# Patient Record
Sex: Male | Born: 1975 | Race: White | Hispanic: No | Marital: Married | Smoking: Never smoker
Health system: Southern US, Community
[De-identification: ages and names within clinical notes are randomized; demographics above are authoritative.]

## PROBLEM LIST (undated history)

## (undated) DIAGNOSIS — E669 Obesity, unspecified: Secondary | ICD-10-CM

## (undated) DIAGNOSIS — R079 Chest pain, unspecified: Secondary | ICD-10-CM

## (undated) DIAGNOSIS — I10 Essential (primary) hypertension: Secondary | ICD-10-CM

## (undated) DIAGNOSIS — E785 Hyperlipidemia, unspecified: Secondary | ICD-10-CM

## (undated) DIAGNOSIS — I2510 Atherosclerotic heart disease of native coronary artery without angina pectoris: Secondary | ICD-10-CM

## (undated) HISTORY — DX: Hyperlipidemia, unspecified: E78.5

## (undated) HISTORY — PX: OTHER SURGICAL HISTORY: SHX169

## (undated) HISTORY — DX: Chest pain, unspecified: R07.9

## (undated) HISTORY — DX: Obesity, unspecified: E66.9

## (undated) HISTORY — DX: Atherosclerotic heart disease of native coronary artery without angina pectoris: I25.10

## (undated) HISTORY — DX: Essential (primary) hypertension: I10

---

## 2000-08-07 HISTORY — PX: APPENDECTOMY: SHX54

## 2004-03-06 ENCOUNTER — Encounter (INDEPENDENT_AMBULATORY_CARE_PROVIDER_SITE_OTHER): Payer: Self-pay | Admitting: *Deleted

## 2004-03-06 ENCOUNTER — Observation Stay (HOSPITAL_COMMUNITY): Admission: EM | Admit: 2004-03-06 | Discharge: 2004-03-07 | Payer: Self-pay | Admitting: Emergency Medicine

## 2009-02-02 ENCOUNTER — Encounter: Admission: RE | Admit: 2009-02-02 | Discharge: 2009-02-02 | Payer: Self-pay | Admitting: Gastroenterology

## 2010-12-23 NOTE — H&P (Signed)
NAME:  Victor Delgado, POTVIN                           ACCOUNT NO.:  [id number]   MEDICAL RECORD NO.:  [id number]                   PATIENT TYPE:  EMS   LOCATION:  ED                                   FACILITY:  [facility]   PHYSICIAN:  Angelia Mould. Derrell Lolling, M.D.             DATE OF BIRTH:  1976/01/23   DATE OF ADMISSION:  03/06/2004  DATE OF DISCHARGE:                                HISTORY & PHYSICAL   CHIEF COMPLAINT:  Abdominal pain and nausea.   HISTORY OF PRESENT ILLNESS:  This is a 35 year old white male who is in  excellent health.  Yesterday at about 6 p.m., he developed the gradual onset  of diffuse abdominal pain. He became nauseated and almost vomited, but has  not vomited.  The pain has become more localized to the right lower quadrant  today.  He denies any fever, chills, diarrhea.  He had a bowel movement this  morning after taking some Peri-Colace last night. He is having no trouble  voiding, he is not having any back pain.  He has not had any prior similar  episodes.  He does not have a history of any gastrointestinal disease.   He was seen at [facility] by Dr. Kathrynn Running who thought he might have  appendicitis and the patient was transferred to the [facility] Emergency  Room after discussion with me.   PAST MEDICAL HISTORY:   PAST SURGICAL HISTORY:  Open reduction internal fixation, left forearm  fracture several years ago.  Otherwise no medical or surgical problems.   CURRENT MEDICATIONS:  None.   DRUG ALLERGIES:  NONE KNOWN.   SOCIAL HISTORY:  The patient is married. He has 1 child, age 54 weeks. 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 Denies the use of tobacco, drinks  alcohol occasionally.   FAMILY HISTORY:  Mother living, has diabetes. Father deceased in an accident  at work.  He has 8 siblings, he has 4 half-brothers. There have been a  couple of appendectomies in the family.   REVIEW OF SYSTEMS:  All systems are reviewed. They are noncontributory  except as  described above.   PHYSICAL EXAMINATION:  VITAL SIGNS:  Temperature 98.9, pulse 91,  respirations 17, blood pressure 131/86.  GENERAL:  Pleasant, healthy-appearing young white male in moderate to mild  distress.  HEENT:  Eyes:  Sclerae clear, extraocular movements intact.  Ears, nose,  mouth and throat, nose, lips, tongue, oropharynx:  Without gross lesions.  He has complete upper dental plate.  NECK:  Supple.  No tenderness, no mass, no jugulovenous distention.  LUNGS:  Clear to auscultation, no chest wall tenderness.  CARDIOVASCULAR:  Regular rate and rhythm, no murmur.  ABDOMEN:  A little bit muscular, soft, hypoactive bowel sounds, not  distended. Liver and spleen not enlarged, no hernias noted.  He has  localized tenderness and involuntary guarding in the right lower quadrant,  a  little bit of direct rebound but not much indirect rebound.  GENITALIA:  Penis, scrotum and testes are normal to inspection. No  tenderness or mass, no inguinal adenopathy, no inguinal hernia.  RECTAL:  Not performed.  EXTREMITIES: Free range of motion, no deformity.  There are some well-healed  scars in the left forearm.  No peripheral edema. Radial, femoral and  posterior tibial pulses are palpable throughout.  NEUROLOGIC:  No gross motor or sensory deficits.   LABORATORY DATA:  On admission, hemoglobin 17.9, white blood cell count  14,300. Urinalysis clear except for 3-4 rbc's.   ASSESSMENT:  Probable acute appendicitis. I cannot rule out Meckel's  diverticulum, cecal diverticulitis, mesenteric adenitis or other more  obscure diagnosis, but it seems highly likely that acute appendicitis is his  primary diagnosis.   PLAN:  1. The patient will be taken to the operating room for diagnostic     laparoscopic and laparoscopic appendectomy.  2. I have explained the indications and details of surgery to the patient.     Risks and complications have been outlined, including but not limited to      bleeding, infection, conversion to open laparotomy, injury to adjacent     organs, wound problems such as infection or hernia, cardiac, pulmonary     and thromboembolic problems. He is also aware of the possibility of     alternative diagnosis which may require more extensive surgery.   At this time, all his questions are answered. He agrees with the plan and is  ready to go ahead with the surgery.                                               Angelia Mould. Derrell Lolling, M.D.    HMI/MEDQ  D:  03/06/2004  T:  03/06/2004  Job:  [id number]   cc:   Kathrynn Running, Dr.

## 2010-12-23 NOTE — Op Note (Signed)
NAME:  Victor Delgado, ROULSTON                           ACCOUNT NO.:  [id number]   MEDICAL RECORD NO.:  [id number]                   PATIENT TYPE:  INP   LOCATION:  0104                                 FACILITY:  [facility]   PHYSICIAN:  Angelia Mould. Derrell Lolling, M.D.             DATE OF BIRTH:  1975/12/20   DATE OF PROCEDURE:  03/06/2004  DATE OF DISCHARGE:                                 OPERATIVE REPORT   PREOPERATIVE DIAGNOSIS:  Acute appendicitis.   POSTOPERATIVE DIAGNOSIS:  Acute appendicitis.   OPERATION PERFORMED:  Laparoscopic appendectomy.   SURGEON:  Angelia Mould. Derrell Lolling, M.D.   OPERATIVE INDICATIONS:  This is a 35 year old white male in excellent health  who presents with an 18-hour history of lower abdominal pain and nausea.  The pain has been progressive and localized to the right lower quadrant.  Examination reveals localized tenderness and guarding in the right lower  quadrant.   Lab work shows a white blood cell count of 14,000, and otherwise workup is  unremarkable.  He is clinically felt to have acute appendicitis and is  brought to the operating room urgently.   OPERATIVE FINDINGS:  The appendix was acutely inflamed and had exudate on  the surface, but there was no evidence of abscess, perforation, or gangrene.  The terminal ileum and cecum looks normal.  There was no ascites.   OPERATIVE TECHNIQUE:  Following the induction of general endotracheal  anesthesia, a Foley catheter was inserted.  The abdomen was prepped and  draped in a sterile fashion.  Marcaine 0.5% with epinephrine was used as  local infiltration anesthetic.  A 12 mm Optiview port was placed in the  lateral aspect of the left rectus sheath just above the level of the  umbilicus.  This was inserted without any difficulty.  Insufflation was  carried out, and we found that everything looked fine.  There was no  evidence of any injury or bleeding.  The liver and gallbladder looks normal.  We placed the patient in  Trendelenburg position and rotated to the left.  We  were able to see the tip of the appendix with an acute inflammation and  exudate.  A 5 mm trocar was placed in the left mid abdomen and another 12 mm  trocar was placed in the left suprapubic area.  We identified the tip of the  appendix and circled it with an endo-loop tie.  We swept the small bowel  away from the appendiceal mesentery, identified the cecum and the ileocecal  valve.  We then used the harmonic scalpel to take down the appendiceal  mesentery stepwise until we had completely mobilized the appendix and could  clearly see the insertion of the base of the appendix onto the cecum.  We  transected the base of the appendix with a GIA stapling device, placed the  appendix in a specimen bag and removed it.  We irrigated the operative  field  with some saline.  The staple line was inspected and looked quite nice.  The  terminal ileum and cecum looked fine.  There was no evidence of any injury.  The trocars were removed under direct vision.  There was no bleeding from  the trocar sites.  The pneumoperitoneum was released.  The fascia of the  left suprapubic area was closed with 0 Vicryl sutures.  The incisions were  irrigated with saline.  Skin was close with a subcuticular suture of 4-0  Vicryl and Steri-Strips.  Clean bandages were placed, and the patient was  taken to the recovery room in stable condition.  Estimated blood loss was  about 10-20 cc.   COMPLICATIONS:  None.   SPONGE, NEEDLE, INSTRUMENT COUNTS:  Correct.                                               Angelia Mould. Derrell Lolling, M.D.    HMI/MEDQ  D:  03/06/2004  T:  03/06/2004  Job:  [id number]   cc:   Dr. Kathrynn Running  [facility]

## 2012-05-13 ENCOUNTER — Ambulatory Visit (INDEPENDENT_AMBULATORY_CARE_PROVIDER_SITE_OTHER): Payer: BC Managed Care – PPO | Admitting: Family Medicine

## 2012-05-13 VITALS — BP 136/84 | HR 70 | Temp 98.5°F | Resp 16 | Ht 72.0 in | Wt 263.4 lb

## 2012-05-13 DIAGNOSIS — L237 Allergic contact dermatitis due to plants, except food: Secondary | ICD-10-CM

## 2012-05-13 DIAGNOSIS — L255 Unspecified contact dermatitis due to plants, except food: Secondary | ICD-10-CM

## 2012-05-13 DIAGNOSIS — H6690 Otitis media, unspecified, unspecified ear: Secondary | ICD-10-CM

## 2012-05-13 MED ORDER — AMOXICILLIN-POT CLAVULANATE 875-125 MG PO TABS: 1.0000 | ORAL_TABLET | Freq: Two times a day (BID) | ORAL | Status: DC

## 2012-05-13 NOTE — Progress Notes (Signed)
  Subjective:    Patient ID: Victor Delgado, male    DOB: 1976/04/27, 36 y.o.   MRN: [id number]  HPI Right ear pain x 1 week, outer ear is red and he states he had gotten poison ivy on it.  Denies fever. He went to dentist Friday and had some dental work done, but states he felt the same afterwards.   Review of Systems No fever or sore throat  Social:  Works on signs    Objective:   Physical Exam  Victor Delgado is very red with some scaling on outer med aspect of the pinna oroph is clear Neck is supple without adenopathy TM on right is dull with white fluid behind TM       Assessment & Plan:  I believe this is a case of otitis media, although there is some poison ivy type rash externally  1. Otitis media  amoxicillin-clavulanate (AUGMENTIN) 875-125 MG per tablet  2. Poison ivy dermatitis  amoxicillin-clavulanate (AUGMENTIN) 875-125 MG per tablet

## 2012-05-15 ENCOUNTER — Ambulatory Visit: Payer: BC Managed Care – PPO

## 2012-05-15 ENCOUNTER — Telehealth: Payer: Self-pay | Admitting: Family Medicine

## 2012-05-15 ENCOUNTER — Ambulatory Visit (INDEPENDENT_AMBULATORY_CARE_PROVIDER_SITE_OTHER): Payer: BC Managed Care – PPO | Admitting: Family Medicine

## 2012-05-15 VITALS — BP 126/83 | HR 55 | Temp 98.1°F | Resp 16 | Ht 73.0 in | Wt 267.0 lb

## 2012-05-15 DIAGNOSIS — H9209 Otalgia, unspecified ear: Secondary | ICD-10-CM

## 2012-05-15 LAB — POCT CBC
Granulocyte percent: 70.6 %G (ref 37–80)
HCT, POC: 53.4 % (ref 43.5–53.7)
Hemoglobin: 17.4 g/dL (ref 14.1–18.1)
MCH, POC: 30.7 pg (ref 27–31.2)
MCV: 94.2 fL (ref 80–97)
RBC: 5.67 M/uL (ref 4.69–6.13)
WBC: 7.3 10*3/uL (ref 4.6–10.2)

## 2012-05-15 MED ORDER — ANTIPYRINE-BENZOCAINE 5.4-1.4 % OT SOLN: 3.0000 [drp] | OTIC | Status: DC | PRN

## 2012-05-15 MED ORDER — HYDROCODONE-ACETAMINOPHEN 5-500 MG PO TABS: 1.0000 | ORAL_TABLET | Freq: Three times a day (TID) | ORAL | Status: DC | PRN

## 2012-05-15 NOTE — Progress Notes (Signed)
[facility] [address], [place] [address] [phone]  Date:  05/15/2012   Name:  Victor Delgado   DOB:  09/20/1975   MRN:  [id number]  PCP:  No primary provider on file.    Chief Complaint: Follow-up   History of Present Illness:  Victor Delgado is a 36 y.o. very pleasant male patient who presents with the following:  He is here to recheck his right ear.  He was here on Monday and was started on Augmentin for possible OM.  He does not feel any different yet.   He did see the dentist last week- he had a filling but states that he was told there was no reason for him to have pain.  He has upper dentures.   No fever.  No ST.  Hearing seems to be ok- may be a little muffled.  No drainage from the ear.  He hurts worse when he lays down. He has pain through the jaw area as well.  He was up all night last night with pain.  He has tried some naproxen which his dentist gave him.    He is generally heatlhy. He does not feel that this is a HA- he does not have any nausea, vomiting or vision change.    There is no problem list on file for this patient.   No past medical history on file.  No past surgical history on file.  History  Substance Use Topics  . Smoking status: Never Smoker   . Smokeless tobacco: Not on file  . Alcohol Use: Not on file    No family history on file.  No Known Allergies  Medication list has been reviewed and updated.  Current Outpatient Prescriptions on File Prior to Visit  Medication Sig Dispense Refill  . amoxicillin-clavulanate (AUGMENTIN) 875-125 MG per tablet Take 1 tablet by mouth 2 (two) times daily.  20 tablet  0    Review of Systems:  As per HPI- otherwise negative. No fever  Physical Examination: Filed Vitals:   05/15/12 0846  BP: 126/83  Pulse: 55  Temp: 98.1 F (36.7 C)  Resp: 16   Filed Vitals:   05/15/12 0846  Height: 6\' 1"  (1.854 m)  Weight: 267 lb (121.11 kg)   Body mass index is 35.23  kg/(m^2). Ideal Body Weight: Weight in (lb) to have BMI = 25: 189.1   GEN: WDWN, NAD, Non-toxic, A & O x 3, obese HEENT: Atraumatic, Normocephalic. Neck supple. No masses, No LAD. Bilateral TM wnl- no injection, redness or bulge on the right, no discharge of the ear canal, oropharynx normal.  PEERL,EOMI.   No tenderness over the TMJ on the right.  The external right ear has a little scaly rash that seems to be healing.  Per pt report this pre-ceded his pain by a couple of weeks so shingles is not likely.  No other rash.  Ears and Nose: No external deformity. CV: RRR, No M/G/R. No JVD. No thrill. No extra heart sounds. PULM: CTA B, no wheezes, crackles, rhonchi. No retractions. No resp. distress. No accessory muscle use. EXTR: No c/c/e NEURO Normal gait.  PSYCH: Normally interactive. Conversant. Not depressed or anxious appearing.  Calm demeanor.   [facility] reading (PRIMARY) by  Dr. Patsy Lager. Negative sinus film  Clinical Data: Sinus infection, right side pain  PARANASAL SINUSES - 1-2 VIEW  Comparison: None  Findings: Single Waters' view of the paranasal sinuses. Nasal septum midline. Paranasal sinuses  clear. No sinus opacification or air-fluid levels. Bones unremarkable.  IMPRESSION: Normal exam.    Results for orders placed in visit on 05/15/12  POCT CBC      Component Value Range   WBC 7.3  4.6 - 10.2 K/uL   Lymph, poc 1.7  0.6 - 3.4   POC LYMPH PERCENT 23.4  10 - 50 %L   MID (cbc) 0.4  0 - 0.9   POC MID % 6.0  0 - 12 %M   POC Granulocyte 5.2  2 - 6.9   Granulocyte percent 70.6  37 - 80 %G   RBC 5.67  4.69 - 6.13 M/uL   Hemoglobin 17.4  14.1 - 18.1 g/dL   HCT, POC 16.1  09.6 - 53.7 %   MCV 94.2  80 - 97 fL   MCH, POC 30.7  27 - 31.2 pg   MCHC 32.6  31.8 - 35.4 g/dL   RDW, POC 04.5     Platelet Count, POC 330  142 - 424 K/uL   MPV 9.4  0 - 99.8 fL  POCT SEDIMENTATION RATE      Component Value Range   POCT SED RATE 14  0 - 22 mm/hr    Assessment and Plan: 1. Ear  pain  POCT CBC, POCT SEDIMENTATION RATE, DG SinUS 1-2 Views, antipyrine-benzocaine (AURALGAN) otic solution, HYDROcodone-acetaminophen (VICODIN) 5-500 MG per tablet   Victor Delgado is having persistent ear pain. His exam now seems normal.  His ESR is ok, making TA even more unlikely (already very unlikely given his age). He denies having a HA.  Will have his try auralgan drops for his ears to see if this will relieve the pain. If so, TM is probably the cause of his pain. Will give him a small amount of vicodin to have on hand in case he needs it.  Continue augmentin- let me know if not better in the next couple of days, Sooner if worse.    Abbe Amsterdam, MD

## 2012-05-15 NOTE — Telephone Encounter (Signed)
Left message with spouse for patient to return call. Please notify patient Sed Rate normal.

## 2012-05-16 NOTE — Telephone Encounter (Signed)
Pt notified of results

## 2012-09-05 ENCOUNTER — Ambulatory Visit (INDEPENDENT_AMBULATORY_CARE_PROVIDER_SITE_OTHER): Payer: BC Managed Care – PPO | Admitting: Emergency Medicine

## 2012-09-05 ENCOUNTER — Ambulatory Visit: Payer: BC Managed Care – PPO

## 2012-09-05 VITALS — BP 130/98 | HR 88 | Temp 99.0°F | Resp 18 | Wt 263.0 lb

## 2012-09-05 DIAGNOSIS — R05 Cough: Secondary | ICD-10-CM

## 2012-09-05 DIAGNOSIS — R0981 Nasal congestion: Secondary | ICD-10-CM

## 2012-09-05 DIAGNOSIS — J3489 Other specified disorders of nose and nasal sinuses: Secondary | ICD-10-CM

## 2012-09-05 DIAGNOSIS — J189 Pneumonia, unspecified organism: Secondary | ICD-10-CM

## 2012-09-05 LAB — POCT INFLUENZA A/B
Influenza A, POC: NEGATIVE
Influenza B, POC: NEGATIVE

## 2012-09-05 MED ORDER — HYDROCODONE-HOMATROPINE 5-1.5 MG/5ML PO SYRP: 5.0000 mL | ORAL_SOLUTION | Freq: Four times a day (QID) | ORAL | Status: DC | PRN

## 2012-09-05 MED ORDER — LEVOFLOXACIN 500 MG PO TABS: 500.0000 mg | ORAL_TABLET | Freq: Every day | ORAL | Status: AC

## 2012-09-05 NOTE — Patient Instructions (Signed)

## 2012-09-05 NOTE — Progress Notes (Signed)
  Subjective:    Patient ID: Victor Delgado, male    DOB: [birth year], 37 y.o.   MRN: [id number]  HPI Pt comes in with nasal congestion and a cough that started Saturday night. Cough has been nonproductive and he feels chest congestion. It started with chills,  headache, no fever that he recalls, denies muscles aches or sore throat, ear pain or head congestion. Nasal drainage is clear. He has been taking Musinex Cold and Flu without relief. He also has taken Day Quil and that seems to work better. Pt denies smoking.    Review of Systems     Objective:   Physical Exam HEENT exam reveals nasal congestion with clear rhinorrhea. Throat is normal. Neck is supple. Chest is clear to both auscultation and percussion except for some rhonchi present anteriorly. I did not hear any wheezing. Breath sounds were symmetrical. [facility] reading (PRIMARY) by  Dr.Cy Bresee there are increased markings present on the lateral film inferior to the hilum. Results for orders placed in visit on 09/05/12  POCT INFLUENZA A/B      Component Value Range   Influenza A, POC Negative     Influenza B, POC Negative          Assessment & Plan:  Flu swab was obtained and chest x-ray was ordered. Chest x-ray was suspicious to me to the infiltrate present on lateral. We'll treat with Levaquin 500 one a day for 7 days Hycodan for cough. He's to take Mucinex twice a day to break up the mucus rest and fluids.

## 2013-12-30 ENCOUNTER — Encounter: Payer: BC Managed Care – PPO | Admitting: Family Medicine

## 2014-01-13 ENCOUNTER — Other Ambulatory Visit: Payer: Self-pay | Admitting: Family Medicine

## 2014-01-13 ENCOUNTER — Encounter: Payer: BC Managed Care – PPO | Admitting: Family Medicine

## 2014-06-28 ENCOUNTER — Ambulatory Visit (INDEPENDENT_AMBULATORY_CARE_PROVIDER_SITE_OTHER): Payer: BC Managed Care – PPO | Admitting: Physician Assistant

## 2014-06-28 VITALS — BP 120/88 | HR 74 | Temp 97.9°F | Resp 16 | Ht 73.0 in | Wt 261.0 lb

## 2014-06-28 DIAGNOSIS — Z23 Encounter for immunization: Secondary | ICD-10-CM

## 2014-06-28 DIAGNOSIS — R05 Cough: Secondary | ICD-10-CM

## 2014-06-28 DIAGNOSIS — R059 Cough, unspecified: Secondary | ICD-10-CM

## 2014-06-28 DIAGNOSIS — R0981 Nasal congestion: Secondary | ICD-10-CM

## 2014-06-28 DIAGNOSIS — J069 Acute upper respiratory infection, unspecified: Secondary | ICD-10-CM

## 2014-06-28 MED ORDER — HYDROCOD POLST-CHLORPHEN POLST 10-8 MG/5ML PO LQCR: 5.0000 mL | Freq: Two times a day (BID) | ORAL | Status: DC | PRN

## 2014-06-28 MED ORDER — GUAIFENESIN ER 1200 MG PO TB12
1.0000 | ORAL_TABLET | Freq: Two times a day (BID) | ORAL | Status: AC | PRN
Start: 2014-06-28 — End: 2014-07-05

## 2014-06-28 MED ORDER — BENZONATATE 100 MG PO CAPS: 100.0000 mg | ORAL_CAPSULE | Freq: Three times a day (TID) | ORAL | Status: AC | PRN

## 2014-06-28 NOTE — Progress Notes (Signed)
MRN: [id number] DOB: 07/07/1976  Subjective:   Victor Delgado is a 38 y.o. male non-smoker, [facility] days.  He states that it began with a slight cough, that has progressed.  It is non-productive and dramatically worsens at night.  He reports mild nasal congestion associated with the cough, and some sore throat, following the cough.  He has some chest pain along his ribs, secondary to the coughing.  He has take alka-seltzer plus which has helped some.  He denies fever, dyspnea, sob, ear symptoms, n/v, and diarrhea.  He reports having some chills.  He does have some sick contacts at work, but is unsure of their illnesses.         Prior to Admission medications   Medication Sig Start Date End Date Taking? Authorizing Provider  HYDROcodone-homatropine (HYCODAN) 5-1.5 MG/5ML syrup Take 5 mLs by mouth every 6 (six) hours as needed for cough. 09/05/12   Collene GobbleSteven A Daub, MD     No Known Allergies    ROS ROS  Other unremarkable unless listed above   Objective:   Vitals: BP 120/88 mmHg  Pulse 74  Temp(Src) 97.9 F (36.6 C) (Oral)  Resp 16  Ht 6\' 1"  (1.854 m)  Wt 261 lb (118.389 kg)  BMI 34.44 kg/m2  SpO2 98%  Physical Exam  Constitutional: He is oriented to person, place, and time and well-developed, well-nourished, and in no distress. Vital signs are normal. No distress.  BP 120/88 mmHg  Pulse 74  Temp(Src) 97.9 F (36.6 C) (Oral)  Resp 16  Ht 6\' 1"  (1.854 m)  Wt 261 lb (118.389 kg)  BMI 34.44 kg/m2  SpO2 98%   HENT:  Right Ear: Hearing normal. Tympanic membrane is not injected and not bulging. No middle ear effusion.  Left Ear: Hearing normal. Tympanic membrane is not injected and not bulging.  No middle ear effusion.  Nose: Rhinorrhea present. No mucosal edema. Right sinus exhibits no maxillary sinus tenderness and no frontal sinus tenderness. Left sinus exhibits no maxillary sinus tenderness and no frontal sinus tenderness.    Mouth/Throat: No uvula swelling. No oropharyngeal exudate, posterior oropharyngeal edema or posterior oropharyngeal erythema.  Pulmonary/Chest: Effort normal and breath sounds normal. No accessory muscle usage. No apnea. No respiratory distress. He has no decreased breath sounds. He has no wheezes. He has no rhonchi. He has no rales.  Lymphadenopathy:       Head (right side): Submandibular adenopathy present.       Head (left side): No submandibular adenopathy present.    He has no cervical adenopathy.    He has no axillary adenopathy.  Neurological: He is oriented to person, place, and time.    Assessment and Plan :  38 year [facility] 3 days of nasal congestion, sore throat, and cough.  More suspicious that this is viral etiology.  He will f/u in 7 days if symptoms do not improve.    Upper respiratory infection, viral  Cough  Guaifenesin (MUCINEX MAXIMUM STRENGTH) 1200 MG TB12, benzonatate (TESSALON) 100 MG capsule, chlorpheniramine-HYDROcodone (TUSSIONEX PENNKINETIC ER) 10-8 MG/5ML LQCR  Nasal congestion Guaifenesin (MUCINEX MAXIMUM STRENGTH) 1200 MG TB12  Need for influenza vaccination  Flu Vaccine QUAD 36+ mos IM  Need for Tdap vaccination Tdap vaccine greater than or equal to 7yo IM    Victor PlattStephanie English, PA-C [facility] 11/22/20154:28 PM

## 2014-06-28 NOTE — Patient Instructions (Signed)
Upper Respiratory Infection, Adult An upper respiratory infection (URI) is also sometimes known as the common cold. The upper respiratory tract includes the nose, sinuses, throat, trachea, and bronchi. Bronchi are the airways leading to the lungs. Most people improve within 1 week, but symptoms can last up to 2 weeks. A residual cough may last even longer.  CAUSES Many different viruses can infect the tissues lining the upper respiratory tract. The tissues become irritated and inflamed and often become very moist. Mucus production is also common. A cold is contagious. You can easily spread the virus to others by oral contact. This includes kissing, sharing a glass, coughing, or sneezing. Touching your mouth or nose and then touching a surface, which is then touched by another person, can also spread the virus. SYMPTOMS  Symptoms typically develop 1 to 3 days after you come in contact with a cold virus. Symptoms vary from person to person. They may include:  Runny nose.  Sneezing.  Nasal congestion.  Sinus irritation.  Sore throat.  Loss of voice (laryngitis).  Cough.  Fatigue.  Muscle aches.  Loss of appetite.  Headache.  Low-grade fever. DIAGNOSIS  You might diagnose your own cold based on familiar symptoms, since most people get a cold 2 to 3 times a year. Your caregiver can confirm this based on your exam. Most importantly, your caregiver can check that your symptoms are not due to another disease such as strep throat, sinusitis, pneumonia, asthma, or epiglottitis. Blood tests, throat tests, and X-rays are not necessary to diagnose a common cold, but they may sometimes be helpful in excluding other more serious diseases. Your caregiver will decide if any further tests are required. RISKS AND COMPLICATIONS  You may be at risk for a more severe case of the common cold if you smoke cigarettes, have chronic heart disease (such as heart failure) or lung disease (such as asthma), or if  you have a weakened immune system. The very young and very old are also at risk for more serious infections. Bacterial sinusitis, middle ear infections, and bacterial pneumonia can complicate the common cold. The common cold can worsen asthma and chronic obstructive pulmonary disease (COPD). Sometimes, these complications can require emergency medical care and may be life-threatening. PREVENTION  The best way to protect against getting a cold is to practice good hygiene. Avoid oral or hand contact with people with cold symptoms. Wash your hands often if contact occurs. There is no clear evidence that vitamin C, vitamin E, echinacea, or exercise reduces the chance of developing a cold. However, it is always recommended to get plenty of rest and practice good nutrition. TREATMENT  Treatment is directed at relieving symptoms. There is no cure. Antibiotics are not effective, because the infection is caused by a virus, not by bacteria. Treatment may include:  Increased fluid intake. Sports drinks offer valuable electrolytes, sugars, and fluids.  Breathing heated mist or steam (vaporizer or shower).  Eating chicken soup or other clear broths, and maintaining good nutrition.  Getting plenty of rest.  Using gargles or lozenges for comfort.  Controlling fevers with ibuprofen or acetaminophen as directed by your caregiver.  Increasing usage of your inhaler if you have asthma. Zinc gel and zinc lozenges, taken in the first 24 hours of the common cold, can shorten the duration and lessen the severity of symptoms. Pain medicines may help with fever, muscle aches, and throat pain. A variety of non-prescription medicines are available to treat congestion and runny nose. Your caregiver   can make recommendations and may suggest nasal or lung inhalers for other symptoms.  HOME CARE INSTRUCTIONS   Only take over-the-counter or prescription medicines for pain, discomfort, or fever as directed by your  caregiver.  Use a warm mist humidifier or inhale steam from a shower to increase air moisture. This may keep secretions moist and make it easier to breathe.  Drink enough water and fluids to keep your urine clear or pale yellow.  Rest as needed.  Return to work when your temperature has returned to normal or as your caregiver advises. You may need to stay home longer to avoid infecting others. You can also use a face mask and careful hand washing to prevent spread of the virus. SEEK MEDICAL CARE IF:   After the first few days, you feel you are getting worse rather than better.  You need your caregiver's advice about medicines to control symptoms.  You develop chills, worsening shortness of breath, or brown or red sputum. These may be signs of pneumonia.  You develop yellow or brown nasal discharge or pain in the face, especially when you bend forward. These may be signs of sinusitis.  You develop a fever, swollen neck glands, pain with swallowing, or white areas in the back of your throat. These may be signs of strep throat. SEEK IMMEDIATE MEDICAL CARE IF:   You have a fever.  You develop severe or persistent headache, ear pain, sinus pain, or chest pain.  You develop wheezing, a prolonged cough, cough up blood, or have a change in your usual mucus (if you have chronic lung disease).  You develop sore muscles or a stiff neck. Document Released: 01/17/2001 Document Revised: 10/16/2011 Document Reviewed: 10/29/2013 ExitCare Patient Information 2015 [employer]. This information is not intended to replace advice given to you by your health care provider. Make sure you discuss any questions you have with your health care provider.  

## 2016-01-26 ENCOUNTER — Telehealth: Payer: Self-pay | Admitting: Cardiovascular Disease

## 2016-01-26 NOTE — Telephone Encounter (Signed)
Received records from [facility] for appointment on 03/03/16 with Dr Manhattan.  Records given to N Hines (medical records) for Dr Ashley's schedule on 03/03/16. lp °

## 2016-03-03 ENCOUNTER — Ambulatory Visit (INDEPENDENT_AMBULATORY_CARE_PROVIDER_SITE_OTHER): Payer: BC Managed Care – PPO | Admitting: Cardiovascular Disease

## 2016-03-03 ENCOUNTER — Encounter: Payer: Self-pay | Admitting: Cardiovascular Disease

## 2016-03-03 VITALS — BP 126/84 | HR 59 | Ht 73.0 in | Wt 259.2 lb

## 2016-03-03 DIAGNOSIS — R079 Chest pain, unspecified: Secondary | ICD-10-CM

## 2016-03-03 DIAGNOSIS — R0683 Snoring: Secondary | ICD-10-CM | POA: Diagnosis not present

## 2016-03-03 DIAGNOSIS — R072 Precordial pain: Secondary | ICD-10-CM

## 2016-03-03 MED ORDER — NITROGLYCERIN 0.4 MG SL SUBL: 0.4000 mg | SUBLINGUAL_TABLET | SUBLINGUAL | 99 refills | Status: DC | PRN

## 2016-03-03 NOTE — Progress Notes (Signed)
Cardiology Office Note   Date:  03/09/2016   ID:  JSHON IBE, DOB Jun 12, 1976, MRN [id number]  PCP:  Astrid Divine, MD  Cardiologist:   Chilton Si, MD   Chief Complaint  Patient presents with  . New Patient (Initial Visit)  . Chest Pain  . Shortness of Breath      History of Present Illness:  Victor Delgado is a 40 y.o. male who presents for an evaluation of chest pain.  Victor Delgado reports episodes of chest pain that started two months ago.  He notices it with exertion such as when running or playing softball and improves with rest.  He reports it as a dull ache in his left chest.  The pain is 5-6/10 in severity and associated with shortness of breath and diaphoresis.  There is no nausea or palpitations.  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  He notes that his diet is poor and he eats fast food frequently.  He knows that his cholesterol has been elevated in the past.  He has been trying to limit soda intake.  Victor Delgado also notes that he has been more tired lately.  He snores loudly and doesn't feel well-rested in the AM.  He is unsure of whether he has apneic episodes.     Past Medical History:  Diagnosis Date  . Chest pain 03/09/2016    No past surgical history on file.   Current Outpatient Prescriptions  Medication Sig Dispense Refill  . aspirin 81 MG tablet Take 81 mg by mouth daily.    . nitroGLYCERIN (NITROSTAT) 0.4 MG SL tablet Place 1 tablet (0.4 mg total) under the tongue every 5 (five) minutes as needed for chest pain. 25 tablet prn   No current facility-administered medications for this visit.     Allergies:   Review of patient's allergies indicates no known allergies.    Social History:  The patient  reports that he has never smoked. He does not have any smokeless tobacco history on file. He reports that he does not drink alcohol or use drugs.   Family History:  The patient's family history  includes Diabetes in his mother; Heart attack in his paternal grandfather and paternal uncle; Hypertension in his mother; Stroke in his maternal grandfather and maternal grandmother.    ROS:  Please see the history of present illness.   Otherwise, review of systems are positive for none.   All other systems are reviewed and negative.    PHYSICAL EXAM: VS:  BP 126/84   Pulse (!) 59   Ht  (1.854 m)   Wt 259 lb 3.2 oz (117.6 kg)   BMI 34.20 kg/m  , BMI Body mass index is 34.2 kg/m. GENERAL:  Well appearing HEENT:  Pupils equal round and reactive, fundi not visualized, oral mucosa unremarkable NECK:  No jugular venous distention, waveform within normal limits, carotid upstroke brisk and symmetric, no bruits, no thyromegaly LYMPHATICS:  No cervical adenopathy LUNGS:  Clear to auscultation bilaterally HEART:  RRR.  PMI not displaced or sustained,S1 and S2 within normal limits, no S3, no S4, no clicks, no rubs, no murmurs ABD:  Flat, positive bowel sounds normal in frequency in pitch, no bruits, no rebound, no guarding, no midline pulsatile mass, no hepatomegaly, no splenomegaly EXT:  2 plus pulses throughout, no edema, no cyanosis no clubbing SKIN:  No rashes no nodules NEURO:  Cranial nerves II  through XII grossly intact, motor grossly intact throughout [facility]:  Cognitively intact, oriented to person place and time   EKG:  EKG is ordered today. The ekg ordered today demonstrates sinus bradycardia rate 59 bpm.   Recent Labs: No results found for requested labs within last 8760 hours.    Lipid Panel No results found for: CHOL, TRIG, HDL, CHOLHDL, VLDL, LDLCALC, LDLDIRECT   7/61/95: Total cholesterol 20, tiglycerides 2:15,HDL 40, LDL 12   01/20/15: sodium 141, potassium 4, BUN 16, creatinine 0.9 AST 21, ALT 35  TSH 2.99  Wt Readings from Last 3 Encounters:  03/03/16 259 lb 3.2 oz (117.6 kg)  06/28/14 261 lb (118.4 kg)  09/05/12 263 lb (119.3 kg)      ASSESSMENT AND  PLAN:  # Exertional chest pain: Mr. Montney has exertional chest pain that is concerning for ischemia.  He also reports chest pain that is related to swinging his hammer, which seems less likely to be due to ischemia.  We will obtain a treadmill stress test to evaluation for ischemia.  We will also give him a prescription for nitroglycerin and instructed him to go to the ED if he has chest pain that persists after three nitroglycerin tablets.  # Fatigue: # Daytime somnolence: We will obtain a sleep study to evaluate for sleep apnea.    Current medicines are reviewed at length with the patient today.  The patient does not have concerns regarding medicines.  The following changes have been made:  Nitroglycerin prn  Labs/ tests ordered today include:   Orders Placed This Encounter  Procedures  . Exercise Tolerance Test  . EKG 12-Lead  . Split night study     Disposition:   FU with Domenica Weightman C. Duke Salvia, MD, [facility] in 1 month    This note was written with the assistance of speech recognition software.  Please excuse any transcriptional errors.   Signed, Kanae Ignatowski C. Duke Salvia, MD, [facility]  03/09/2016 7:48 PM    [facility]

## 2016-03-03 NOTE — Patient Instructions (Addendum)
Medication Instructions:  Use your NTG under your tongue for recurrent chest pain. May take one tablet every 5 minutes. If you are still having discomfort after 3 tablets in 15 minutes, call 911.  Labwork: none  Testing/Procedures: Your physician has requested that you have an exercise tolerance test. For further information please visit [email or web address] Please also follow instruction sheet, as given.  Your physician has recommended that you have a sleep study. This test records several body functions during sleep, including: brain activity, eye movement, oxygen and carbon dioxide blood levels, heart rate and rhythm, breathing rate and rhythm, the flow of air through your mouth and nose, snoring, body muscle movements, and chest and belly movement.  Follow-Up: Your physician recommends that you schedule a follow-up appointment in: 1 month ov  If you need a refill on your cardiac medications before your next appointment, please call your pharmacy.   Exercise Stress Electrocardiogram An exercise stress electrocardiogram is a test to check how blood flows to your heart. It is done to find areas of poor blood flow. You will need to walk on a treadmill for this test. The electrocardiogram will record your heartbeat when you are at rest and when you are exercising. BEFORE THE PROCEDURE  Do not have drinks with caffeine or foods with caffeine for 24 hours before the test, or as told by your doctor. This includes coffee, tea (even decaf tea), sodas, chocolate, and cocoa.  Follow your doctor's instructions about eating and drinking before the test.  Ask your doctor what medicines you should or should not take before the test. Take your medicines with water unless told by your doctor not to.  If you use an inhaler, bring it with you to the test.  Bring a snack to eat after the test.  Do not  smoke for 4 hours before the test.  Do not put lotions, powders, creams, or oils on your chest  before the test.  Wear comfortable shoes and clothing. PROCEDURE  You will have patches put on your chest. Small areas of your chest may need to be shaved. Wires will be connected to the patches.  Your heart rate will be watched while you are resting and while you are exercising.  You will walk on the treadmill. The treadmill will slowly get faster to raise your heart rate.  The test will take about 1-2 hours. AFTER THE PROCEDURE  Your heart rate and blood pressure will be watched after the test.  You may return to your normal diet, activities, and medicines or as told by your doctor.   This information is not intended to replace advice given to you by your health care provider. Make sure you discuss any questions you have with your health care provider.   Document Released: 01/10/2008 Document Revised: 08/14/2014 Document Reviewed: 03/31/2013 Elsevier Interactive Patient Education [employer]

## 2016-03-09 ENCOUNTER — Encounter: Payer: Self-pay | Admitting: Cardiovascular Disease

## 2016-03-09 DIAGNOSIS — R079 Chest pain, unspecified: Secondary | ICD-10-CM

## 2016-03-09 HISTORY — DX: Chest pain, unspecified: R07.9

## 2016-03-16 ENCOUNTER — Encounter: Payer: Self-pay | Admitting: Cardiovascular Disease

## 2016-03-17 ENCOUNTER — Ambulatory Visit (INDEPENDENT_AMBULATORY_CARE_PROVIDER_SITE_OTHER): Payer: BC Managed Care – PPO

## 2016-03-17 DIAGNOSIS — R079 Chest pain, unspecified: Secondary | ICD-10-CM | POA: Diagnosis not present

## 2016-03-17 LAB — EXERCISE TOLERANCE TEST
CHL CUP RESTING HR STRESS: 68 {beats}/min
CHL CUP STRESS STAGE 1 HR: 88 {beats}/min
CHL CUP STRESS STAGE 1 SPEED: 0 mph
CHL CUP STRESS STAGE 2 GRADE: 0 %
CHL CUP STRESS STAGE 2 HR: 98 {beats}/min
CHL CUP STRESS STAGE 2 SPEED: 1 mph
CHL CUP STRESS STAGE 3 GRADE: 0 %
CHL CUP STRESS STAGE 5 SPEED: 2.5 mph
CHL CUP STRESS STAGE 6 GRADE: 14 %
CHL CUP STRESS STAGE 6 SPEED: 3.4 mph
CHL CUP STRESS STAGE 7 DBP: 92 mmHg
CHL CUP STRESS STAGE 7 HR: 96 {beats}/min
CHL CUP STRESS STAGE 7 SBP: 206 mmHg
CHL CUP STRESS STAGE 8 DBP: 96 mmHg
CHL CUP STRESS STAGE 8 HR: 88 {beats}/min
CHL CUP STRESS STAGE 8 SBP: 151 mmHg
CHL CUP STRESS STAGE 8 SPEED: 0 mph
CHL RATE OF PERCEIVED EXERTION: 15
CSEPED: 6 min
CSEPEDS: 26 s
CSEPHR: 71 %
CSEPPHR: 130 {beats}/min
Estimated workload: 7.6 METS
MPHR: 182 {beats}/min
Percent of predicted max HR: 71 %
Stage 1 DBP: 90 mmHg
Stage 1 Grade: 0 %
Stage 1 SBP: 134 mmHg
Stage 3 HR: 97 {beats}/min
Stage 3 Speed: 1 mph
Stage 4 DBP: 82 mmHg
Stage 4 Grade: 10 %
Stage 4 HR: 98 {beats}/min
Stage 4 SBP: 157 mmHg
Stage 4 Speed: 1.7 mph
Stage 5 DBP: 75 mmHg
Stage 5 Grade: 12 %
Stage 5 HR: 120 {beats}/min
Stage 5 SBP: 189 mmHg
Stage 6 HR: 130 {beats}/min
Stage 7 Grade: 0 %
Stage 7 Speed: 0 mph
Stage 8 Grade: 0 %

## 2016-03-21 ENCOUNTER — Telehealth: Payer: Self-pay | Admitting: *Deleted

## 2016-03-21 DIAGNOSIS — R9439 Abnormal result of other cardiovascular function study: Secondary | ICD-10-CM

## 2016-03-21 NOTE — Telephone Encounter (Signed)
-----   Message from Chilton Siiffany Ashton, MD sent at 03/21/2016  1:09 PM EDT ----- Stress test was abnormal.  We will get a Lexiscan Myoview to better evaluate.

## 2016-03-21 NOTE — Telephone Encounter (Signed)
Patient aware, will send to scheduling to get lexiscan and follow up ov scheduled

## 2016-03-23 ENCOUNTER — Telehealth (HOSPITAL_COMMUNITY): Payer: Self-pay

## 2016-03-23 NOTE — Telephone Encounter (Signed)
Encounter complete. 

## 2016-03-28 ENCOUNTER — Ambulatory Visit (HOSPITAL_COMMUNITY)
Admission: RE | Admit: 2016-03-28 | Discharge: 2016-03-28 | Disposition: A | Discharge status: Home / Self Care | Payer: BC Managed Care – PPO | Source: Ambulatory Visit | Attending: Cardiovascular Disease | Admitting: Cardiovascular Disease

## 2016-03-28 DIAGNOSIS — R9439 Abnormal result of other cardiovascular function study: Secondary | ICD-10-CM

## 2016-03-28 DIAGNOSIS — R079 Chest pain, unspecified: Secondary | ICD-10-CM | POA: Diagnosis not present

## 2016-03-28 DIAGNOSIS — R5383 Other fatigue: Secondary | ICD-10-CM | POA: Diagnosis not present

## 2016-03-28 DIAGNOSIS — R0683 Snoring: Secondary | ICD-10-CM | POA: Insufficient documentation

## 2016-03-28 DIAGNOSIS — R400 Somnolence: Secondary | ICD-10-CM | POA: Insufficient documentation

## 2016-03-28 DIAGNOSIS — R0609 Other forms of dyspnea: Secondary | ICD-10-CM | POA: Insufficient documentation

## 2016-03-28 DIAGNOSIS — R0602 Shortness of breath: Secondary | ICD-10-CM | POA: Diagnosis not present

## 2016-03-28 DIAGNOSIS — R61 Generalized hyperhidrosis: Secondary | ICD-10-CM | POA: Insufficient documentation

## 2016-03-28 DIAGNOSIS — Z8249 Family history of ischemic heart disease and other diseases of the circulatory system: Secondary | ICD-10-CM | POA: Diagnosis not present

## 2016-03-28 DIAGNOSIS — Z6834 Body mass index (BMI) 34.0-34.9, adult: Secondary | ICD-10-CM | POA: Insufficient documentation

## 2016-03-28 DIAGNOSIS — E669 Obesity, unspecified: Secondary | ICD-10-CM | POA: Diagnosis not present

## 2016-03-28 LAB — MYOCARDIAL PERFUSION IMAGING
CHL CUP NUCLEAR SSS: 2
LV dias vol: 165 mL (ref 62–150)
LV sys vol: 79 mL
NUC STRESS TID: 1.19
Peak HR: 130 {beats}/min
Rest HR: 61 {beats}/min
SDS: 0
SRS: 2

## 2016-03-28 MED ORDER — REGADENOSON 0.4 MG/5ML IV SOLN
0.4000 mg | Freq: Once | INTRAVENOUS | Status: AC
  Administered 2016-03-28: 0.4 mg via INTRAVENOUS

## 2016-03-28 MED ORDER — TECHNETIUM TC 99M TETROFOSMIN IV KIT
9.0000 | PACK | Freq: Once | INTRAVENOUS | Status: AC | PRN
  Administered 2016-03-28: 9 via INTRAVENOUS
  Filled 2016-03-28: qty 9

## 2016-03-28 MED ORDER — TECHNETIUM TC 99M TETROFOSMIN IV KIT
30.6000 | PACK | Freq: Once | INTRAVENOUS | Status: AC | PRN
  Administered 2016-03-28: 30.6 via INTRAVENOUS
  Filled 2016-03-28: qty 31

## 2016-04-17 ENCOUNTER — Ambulatory Visit (HOSPITAL_BASED_OUTPATIENT_CLINIC_OR_DEPARTMENT_OTHER): Payer: BC Managed Care – PPO | Attending: Cardiovascular Disease | Admitting: Cardiovascular Disease

## 2016-04-17 VITALS — Ht 73.0 in | Wt 255.0 lb

## 2016-04-17 DIAGNOSIS — R0683 Snoring: Secondary | ICD-10-CM | POA: Diagnosis not present

## 2016-04-17 DIAGNOSIS — G4761 Periodic limb movement disorder: Secondary | ICD-10-CM | POA: Insufficient documentation

## 2016-04-17 DIAGNOSIS — Z79899 Other long term (current) drug therapy: Secondary | ICD-10-CM | POA: Diagnosis not present

## 2016-04-17 DIAGNOSIS — Z7982 Long term (current) use of aspirin: Secondary | ICD-10-CM | POA: Diagnosis not present

## 2016-04-26 ENCOUNTER — Ambulatory Visit (INDEPENDENT_AMBULATORY_CARE_PROVIDER_SITE_OTHER): Payer: BC Managed Care – PPO | Admitting: Cardiovascular Disease

## 2016-04-26 ENCOUNTER — Encounter: Payer: Self-pay | Admitting: Cardiovascular Disease

## 2016-04-26 VITALS — BP 139/92 | HR 60 | Ht 73.0 in | Wt 262.4 lb

## 2016-04-26 DIAGNOSIS — E785 Hyperlipidemia, unspecified: Secondary | ICD-10-CM

## 2016-04-26 DIAGNOSIS — R079 Chest pain, unspecified: Secondary | ICD-10-CM | POA: Diagnosis not present

## 2016-04-26 DIAGNOSIS — I10 Essential (primary) hypertension: Secondary | ICD-10-CM

## 2016-04-26 DIAGNOSIS — R9439 Abnormal result of other cardiovascular function study: Secondary | ICD-10-CM | POA: Diagnosis not present

## 2016-04-26 DIAGNOSIS — E669 Obesity, unspecified: Secondary | ICD-10-CM

## 2016-04-26 HISTORY — DX: Essential (primary) hypertension: I10

## 2016-04-26 HISTORY — DX: Obesity, unspecified: E66.9

## 2016-04-26 HISTORY — DX: Hyperlipidemia, unspecified: E78.5

## 2016-04-26 LAB — BASIC METABOLIC PANEL
BUN: 12 mg/dL (ref 7–25)
CHLORIDE: 104 mmol/L (ref 98–110)
CO2: 26 mmol/L (ref 20–31)
CREATININE: 0.83 mg/dL (ref 0.60–1.35)
Calcium: 9.1 mg/dL (ref 8.6–10.3)
Glucose, Bld: 84 mg/dL (ref 65–99)
POTASSIUM: 4.1 mmol/L (ref 3.5–5.3)
SODIUM: 138 mmol/L (ref 135–146)

## 2016-04-26 MED ORDER — LISINOPRIL 5 MG PO TABS: 5.0000 mg | ORAL_TABLET | Freq: Every day | ORAL | 1 refills | Status: DC

## 2016-04-26 NOTE — Progress Notes (Signed)
Cardiology Office Note   Date:  04/26/2016   ID:  LLOYDE Delgado, DOB 10-17-1975, MRN [id number]  PCP:  Lillia Mountain, MD  Cardiologist:   Chilton Si, MD   Chief Complaint  Patient presents with  . Follow-up    Pt states having headaches, and falling twice.       History of Present Illness:  Victor Delgado is a 40 y.o. male who presents for follow up on chest pain.  Victor Delgado was seen 03/03/16 with episodes of exertional chest pain.  He was referred for ETT a 03/17/16 and developed a rate-related LBBB.  He was also noted to have a hypertensive response to exercise.  He was referred for [facility] that was negative for ischemia.   He denies any recent episodes of chest pain.  However, he has not been exerting himself due to fear that the chest pain will recur.  He is still concerned, despite the stress test, that his chest pain is angina, as he developed chest pain at the time the LBBB occurred on his treadmill ETT.  He is also afraid because all his maternal uncles had heart attacks and strokes at a young age.  He has not been exercising and his diet remains poor.  He denies lower extremity edema, orthopnea or PND.  He completed his sleep study but the results are not back yet.    Past Medical History:  Diagnosis Date  . Chest pain 03/09/2016  . Essential hypertension 04/26/2016  . Hyperlipidemia 04/26/2016  . Obesity (BMI 30-39.9) 04/26/2016    No past surgical history on file.   Current Outpatient Prescriptions  Medication Sig Dispense Refill  . aspirin 81 MG tablet Take 81 mg by mouth daily.    . nitroGLYCERIN (NITROSTAT) 0.4 MG SL tablet Place 1 tablet (0.4 mg total) under the tongue every 5 (five) minutes as needed for chest pain. 25 tablet prn  . lisinopril (PRINIVIL,ZESTRIL) 5 MG tablet Take 1 tablet (5 mg total) by mouth daily. 90 tablet 1   No current facility-administered medications for this visit.     Allergies:   Review of patient's allergies indicates  no known allergies.    Social History:  The patient  reports that he has never smoked. He has never used smokeless tobacco. He reports that he does not drink alcohol or use drugs.   Family History:  The patient's family history includes Diabetes in his mother; Heart attack in his paternal grandfather and paternal uncle; Hypertension in his mother; Stroke in his maternal grandfather and maternal grandmother.    ROS:  Please see the history of present illness.   Otherwise, review of systems are positive for none.   All other systems are reviewed and negative.    PHYSICAL EXAM: VS:  BP (!) 139/92   Pulse 60   Ht 6\' 1"  (1.854 m)   Wt 262 lb 6.4 oz (119 kg)   BMI 34.62 kg/m  , BMI Body mass index is 34.62 kg/m. GENERAL:  Well appearing HEENT:  Pupils equal round and reactive, fundi not visualized, oral mucosa unremarkable NECK:  No jugular venous distention, waveform within normal limits, carotid upstroke brisk and symmetric, no bruits, no thyromegaly LYMPHATICS:  No cervical adenopathy LUNGS:  Clear to auscultation bilaterally HEART:  RRR.  PMI not displaced or sustained,S1 and S2 within normal limits, no S3, no S4, no clicks, no rubs, no murmurs ABD:  Flat, positive bowel sounds normal in frequency in pitch, no  bruits, no rebound, no guarding, no midline pulsatile mass, no hepatomegaly, no splenomegaly EXT:  2 plus pulses throughout, no edema, no cyanosis no clubbing SKIN:  No rashes no nodules NEURO:  Cranial nerves II through XII grossly intact, motor grossly intact throughout PSYCH:  Cognitively intact, oriented to person place and time   EKG:  EKG is not ordered today. The ekg ordered 03/03/16 demonstrates sinus bradycardia rate 59 bpm.   Recent Labs: No results found for requested labs within last 8760 hours.    Lipid Panel No results found for: CHOL, TRIG, HDL, CHOLHDL, VLDL, LDLCALC, LDLDIRECT   1/61/09/606/15/20/17: Total cholesterol 234, tiglycerides 269, HDL 42,  LDL  139  01/20/15: Sodium 454141, potassium 4, BUN 16, creatinine 0.9 AST 21, ALT 35  TSH 2.99  ETT 03/17/16:  Blood pressure demonstrated a hypertensive response to exercise.   ETT with duration of 6:26 sec; no chest pain or palpitations; patient developed probable rate related LBBB and therefore study terminated early (he did not reach THR); hypertensive response; suggest lexiscan nuclear study to screen for ischemia if indicated.  Lexiscan Myoview 03/28/16:  The left ventricular ejection fraction is mildly decreased (45-54%).  Nuclear stress EF: 52%.  No T wave inversion was noted during stress.  There was no ST segment deviation noted during stress.  This is a low risk study.     Wt Readings from Last 3 Encounters:  04/26/16 262 lb 6.4 oz (119 kg)  04/18/16 255 lb (115.7 kg)  03/28/16 259 lb (117.5 kg)      ASSESSMENT AND PLAN:  # Exertional chest pain: Mr. Victor Delgado has exertional chest pain and was noted to have a LBBB on ETT.  It was unclear whether this was a rate-related LBBB or ischemia.  His Lexiscan Myoview was negative for ischemia.  However, he is still concerned about his exertional chest pain and wants to make sure that this is not a false negative.  We will get a cardiac CT-A to better evaluate.  I am suspicious that poorly-controlled hypertension may also be contributing.   # Fatigue: # Daytime somnolence: Sleep study pending.   # Hypertension: Mr. Victor Delgado' blood pressure is slightly elevated today and increased to over 200 mmHg with stress.  We will start lisinopril 5 mg daily.  He will check his BP at home and increase to 10 mg daily if his BP remains >140/90.  # Hyperlipidemia:  Mr. Victor Delgado's cholesterol levels are elevated.  However, his ASCVD 10 year risk is 2.3%.  I suggested that he work on exercise at least 3 times per week and limiting fried/fatty foods.  We will repeat his lipids in 3 months.  Diet and exercise.  Repeat in 3 monhts.   Current medicines are  reviewed at length with the patient today.  The patient does not have concerns regarding medicines.  The following changes have been made:  Nitroglycerin prn  Labs/ tests ordered today include:   Orders Placed This Encounter  Procedures  . CT Heart Morp W/Cta Cor W/Score W/Ca W/Cm &/Or Wo/Cm  . Basic metabolic panel     Disposition:   FU with Hannelore Bova C. Duke Salviaandolph, MD, [facility] in 3 months   This note was written with the assistance of speech recognition software.  Please excuse any transcriptional errors.   Signed, Baylen Buckner C. Duke Salviaandolph, MD, [facility]  04/26/2016 2:30 PM    [facility]

## 2016-04-26 NOTE — Patient Instructions (Signed)
Medication Instructions:  START LISINOPRIL 5 MG DAILY  Labwork: BMET AT [facility] ON THE FIRST FLOOR  Testing/Procedures: Your physician has requested that you have cardiac CT. Cardiac computed tomography (CT) is a painless test that uses an x-ray machine to take clear, detailed pictures of your heart. For further information please visit [email or web address]. Please follow instruction sheet as given.  Follow-Up: Your physician recommends that you schedule a follow-up appointment in: 3 MONTH OV  Any Other Special Instructions Will Be Listed Below (If Applicable). MONITOR YOU BLOOD PRESSURE AT HOME. IF YOUR BLOOD PRESSURE IS NOT BELOW 140/90 YOU NEED TO INCREASE YOUR LISINOPRIL TO 10 MG DAILY. PLEASE CALL THE OFFICE IF YOU NEED TO INCREASE SO A NEW RX CAN BE SENT TO YOUR PHARMACY AND CHART CAN BE UPDATED   If you need a refill on your cardiac medications before your next appointment, please call your pharmacy.

## 2016-04-30 NOTE — Procedures (Signed)
     Patient Name: Victor Delgado, Victor Delgado Date: 04/17/2016 Gender: Male D.O.B: May 24, 1976 Age (years): 40 Referring Provider: Chilton Siiffany [place] Height (inches): 73 Interpreting Physician: Nicki Guadalajarahomas Tiawanna Luchsinger MD, ABSM Weight (lbs): 255 RPSGT: Rolene ArbourMcConnico, Yvonne BMI: 34 MRN: [id number] Neck Size: 16.00  CLINICAL INFORMATION [name] Type: NPSG Indication for [name]: Snoring Epworth Sleepiness Score: 6  [name] TECHNIQUE As per the AASM Manual for the Scoring of Sleep and Associated Events v2.3 (April 2016) with a hypopnea requiring 4% desaturations. The channels recorded and monitored were frontal, central and occipital EEG, electrooculogram (EOG), submentalis EMG (chin), nasal and oral airflow, thoracic and abdominal wall motion, anterior tibialis EMG, snore microphone, electrocardiogram, and pulse oximetry.  MEDICATIONS aspirin 81 MG tablet lisinopril (PRINIVIL,ZESTRIL) 5 MG tablet nitroGLYCERIN (NITROSTAT) 0.4 MG SL tablet  Medications self-administered by patient during [name] : No sleep medicine administered.  SLEEP ARCHITECTURE The Delgado was initiated at 9:34:24 PM and ended at 4:28:51 AM. Sleep onset time was 21.3 minutes and the sleep efficiency was 80.7%. The total sleep time was 334.6 minutes. Stage REM latency was 190.0 minutes. The patient spent 2.69% of the night in stage N1 sleep, 84.91% in stage N2 sleep, 0.00% in stage N3 and 12.40% in REM. Alpha intrusion was absent. Supine sleep was 72.34%.  RESPIRATORY PARAMETERS The overall apnea/hypopnea index (AHI) was 0.2 per hour. There were 0 total apneas, including 0 obstructive, 0 central and 0 mixed apneas. There were 1 hypopneas and 3 RERAs. The AHI during Stage REM sleep was 0.0 per hour. AHI while supine was 0.2 per hour. The mean oxygen saturation was 95.19%. The minimum SpO2 during sleep was 89.00%. Moderate snoring was noted during this Delgado.  CARDIAC DATA The 2 lead EKG demonstrated sinus  rhythm. The mean heart rate was 57.62 beats per minute. Other EKG findings include: None.  LEG MOVEMENT DATA The total PLMS were 94 with a resulting PLMS index of 16.85. Associated arousal with leg movement index was 10.6 .  IMPRESSIONS - No significant obstructive sleep apnea (AHI = 0.2/h; RDI 0.7/h). - No significant central sleep apnea occurred during this Delgado (CAI = 0.0/h). - Minimal oxygen desaturation to a nadir of 89.00%. - Abnormal sleep architiecture with absence of slow wave sleep and prolonged latency to REM sleep.  - The patient snored with Moderate snoring volume. - No cardiac abnormalities were noted during this Delgado. - Mild periodic limb movements of sleep with a PLMS index of 16.85. Associated arousals were significant.  DIAGNOSIS - Periodic Limb Movement Disorder of Sleep - Snoring  RECOMMENDATIONS - There is no indication for CPAP therapy. - Efforts should be made to optimize nasal and oropharyngeal patency; consider alternatives for the treatment of snoring. - If patient is symptomatic with restless legs, consider a trial of pharmocothrapy.  - Avoid alcohol, sedatives and other CNS depressants that may worsen sleep apnea and disrupt normal sleep architecture. - Sleep hygiene should be reviewed to assess factors that may improve sleep quality. - Weight management and regular exercise should be initiated or continued if appropriate.   [Electronically signed] 04/30/2016 09:50 PM  Nicki Guadalajarahomas Tkai Serfass MD, [facility] CenterFACC, ABSM Diplomate, American Board of Sleep Medicine   NPI: [id number] [facility] PH: [phone]   FX: [phone] ACCREDITED BY THE AMERICAN ACADEMY OF SLEEP MEDICINE

## 2016-05-01 ENCOUNTER — Encounter: Payer: Self-pay | Admitting: Cardiovascular Disease

## 2016-05-02 ENCOUNTER — Telehealth: Payer: Self-pay | Admitting: Cardiovascular Disease

## 2016-05-02 NOTE — Telephone Encounter (Signed)
Returned call to patient, who notes since starting lisinopril last week he's had a couple of brief episodes of dizziness, usually resolving within a matter of seconds, and usually occurring in the afternoon. He is taking med in morning and wants to know if he should cut dose, or take medication in evening, or if other recommendations. Unfortunately he does not have any home BP readings to report. Informed pt I would inquire as to any recommendations but that we may need to have BP readings to decide on adjustments. Pt voiced understanding.

## 2016-05-02 NOTE — Telephone Encounter (Signed)
Let's try 2.5mg  daily

## 2016-05-02 NOTE — Telephone Encounter (Signed)
Left message to call back  

## 2016-05-02 NOTE — Telephone Encounter (Signed)
Mr. Victor Delgado is calling because he was placed  on a new medication Lisinopril 5mg  and its making him dizzy . Had a couple of dizzy spells . Wants to know if he should continue to take the medication .  Please call   Thanks

## 2016-05-04 MED ORDER — LISINOPRIL 5 MG PO TABS: 2.5000 mg | ORAL_TABLET | Freq: Every day | ORAL | 1 refills | Status: DC

## 2016-05-04 NOTE — Telephone Encounter (Signed)
Spoke with pt, he is aware of dr Nordic's recommendations. He will call with continued problems.

## 2016-05-04 NOTE — Telephone Encounter (Signed)
Pt said he was told to call back and talk to somebody in triage.

## 2016-05-04 NOTE — Telephone Encounter (Signed)
Left message for pt to call.

## 2016-05-12 ENCOUNTER — Encounter (HOSPITAL_COMMUNITY): Payer: Self-pay

## 2016-05-12 ENCOUNTER — Ambulatory Visit (HOSPITAL_COMMUNITY)
Admission: RE | Admit: 2016-05-12 | Discharge: 2016-05-12 | Disposition: A | Discharge status: Home / Self Care | Payer: BC Managed Care – PPO | Source: Ambulatory Visit | Attending: Cardiovascular Disease | Admitting: Cardiovascular Disease

## 2016-05-12 DIAGNOSIS — R079 Chest pain, unspecified: Secondary | ICD-10-CM

## 2016-05-12 DIAGNOSIS — R9439 Abnormal result of other cardiovascular function study: Secondary | ICD-10-CM | POA: Diagnosis present

## 2016-05-12 DIAGNOSIS — I2510 Atherosclerotic heart disease of native coronary artery without angina pectoris: Secondary | ICD-10-CM | POA: Diagnosis not present

## 2016-05-12 MED ORDER — IOPAMIDOL (ISOVUE-370) INJECTION 76%
INTRAVENOUS | Status: AC
  Administered 2016-05-12: 80 mL
  Filled 2016-05-12: qty 100

## 2016-05-12 MED ORDER — NITROGLYCERIN 0.4 MG SL SUBL
0.8000 mg | SUBLINGUAL_TABLET | Freq: Once | SUBLINGUAL | Status: AC
  Administered 2016-05-12: 0.8 mg via SUBLINGUAL
  Filled 2016-05-12: qty 25

## 2016-05-12 MED ORDER — METOPROLOL TARTRATE 5 MG/5ML IV SOLN
INTRAVENOUS | Status: AC
  Filled 2016-05-12: qty 15

## 2016-05-12 MED ORDER — METOPROLOL TARTRATE 5 MG/5ML IV SOLN
5.0000 mg | INTRAVENOUS | Status: DC | PRN
  Administered 2016-05-12 (×2): 5 mg via INTRAVENOUS
  Filled 2016-05-12: qty 5

## 2016-05-12 MED ORDER — NITROGLYCERIN 0.4 MG SL SUBL
SUBLINGUAL_TABLET | SUBLINGUAL | Status: AC
  Filled 2016-05-12: qty 2

## 2016-05-19 ENCOUNTER — Telehealth: Payer: Self-pay | Admitting: *Deleted

## 2016-05-19 DIAGNOSIS — Z79899 Other long term (current) drug therapy: Secondary | ICD-10-CM

## 2016-05-19 DIAGNOSIS — E785 Hyperlipidemia, unspecified: Secondary | ICD-10-CM

## 2016-05-19 MED ORDER — ROSUVASTATIN CALCIUM 20 MG PO TABS: 20.0000 mg | ORAL_TABLET | Freq: Every day | ORAL | 5 refills | Status: DC

## 2016-05-19 NOTE — Telephone Encounter (Signed)
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  It does mean that we should be aggressive with controlling risk factors, including cholesterol.  Continue with plan to exercise and diet.  Repeat lipids in the 3 months.

## 2016-05-22 NOTE — Telephone Encounter (Signed)
Patient continues to have chest pain with physical activity and is always tired. Discussed with Dr Duke Salviaandolph and recommended Crestor 20 mg daily, confirm taking ASA 81 mg daily, and follow up fasting LP/HFP in 6 weeks Advised patient, verbalized understanding

## 2016-06-09 ENCOUNTER — Telehealth: Payer: Self-pay | Admitting: Cardiovascular Disease

## 2016-06-09 NOTE — Telephone Encounter (Signed)
New message  Pt wants to find out if his heart disease is genetic or not  Please call back

## 2016-06-09 NOTE — Telephone Encounter (Signed)
Returned call. Patient noted he has questions. Asking specifically about his cardiac diagnoses and whether it's known that there is a genetic component to his problems. Patient is mainly concerned because his son is on medication to treat ADHD, and he wants to make sure his son does not have any risk factors or contraindications for taking medications. Pt aware I am not sure how to answer this question, but would see if Dr. Duke Salviaandolph can give guidance as able.

## 2016-06-13 NOTE — Telephone Encounter (Signed)
Left message to call back  

## 2016-06-13 NOTE — Telephone Encounter (Signed)
Mr. Victor Delgado has high blood pressure and high cholesterol, both of which are more common in certain families. There is not a strict genetic correlation. This is not affected by ADH medications-son should be able to take whatever his physician prescribes for him. He does maintain that the whole family should be careful about diet and increase exercise.

## 2016-06-14 ENCOUNTER — Telehealth: Payer: Self-pay | Admitting: *Deleted

## 2016-06-14 NOTE — Telephone Encounter (Signed)
Advised patient

## 2016-06-14 NOTE — Telephone Encounter (Signed)
Spoke with patient and he did stop his Crestor 20 mg secondary to headaches, only took for 3 days. He does still want to have labs checked later this month since he has been working on his diet/exercise and trying to control with this.  Will forward to Dr Duke Salviaandolph so she will be aware

## 2016-06-15 NOTE — Telephone Encounter (Signed)
OK- that will be fine 

## 2016-07-06 LAB — HEPATIC FUNCTION PANEL
ALT: 35 U/L (ref 9–46)
AST: 20 U/L (ref 10–40)
Albumin: 4.6 g/dL (ref 3.6–5.1)
Alkaline Phosphatase: 65 U/L (ref 40–115)
BILIRUBIN DIRECT: 0.1 mg/dL (ref <=0.2)
BILIRUBIN INDIRECT: 0.6 mg/dL (ref 0.2–1.2)
BILIRUBIN TOTAL: 0.7 mg/dL (ref 0.2–1.2)
Total Protein: 7.1 g/dL (ref 6.1–8.1)

## 2016-07-06 LAB — LIPID PANEL
CHOL/HDL RATIO: 4.7 ratio (ref <5.0)
Cholesterol: 227 mg/dL — ABNORMAL HIGH (ref <200)
HDL: 48 mg/dL (ref >40)
LDL CALC: 154 mg/dL — AB (ref <100)
Triglycerides: 125 mg/dL (ref <150)
VLDL: 25 mg/dL (ref <30)

## 2016-07-26 ENCOUNTER — Encounter: Payer: Self-pay | Admitting: Cardiovascular Disease

## 2016-07-26 ENCOUNTER — Ambulatory Visit (INDEPENDENT_AMBULATORY_CARE_PROVIDER_SITE_OTHER): Payer: BC Managed Care – PPO | Admitting: Cardiovascular Disease

## 2016-07-26 VITALS — BP 123/86 | Ht 73.0 in | Wt 255.6 lb

## 2016-07-26 DIAGNOSIS — Z79899 Other long term (current) drug therapy: Secondary | ICD-10-CM | POA: Diagnosis not present

## 2016-07-26 DIAGNOSIS — E785 Hyperlipidemia, unspecified: Secondary | ICD-10-CM | POA: Diagnosis not present

## 2016-07-26 DIAGNOSIS — I2510 Atherosclerotic heart disease of native coronary artery without angina pectoris: Secondary | ICD-10-CM

## 2016-07-26 DIAGNOSIS — I119 Hypertensive heart disease without heart failure: Secondary | ICD-10-CM | POA: Diagnosis not present

## 2016-07-26 HISTORY — DX: Atherosclerotic heart disease of native coronary artery without angina pectoris: I25.10

## 2016-07-26 MED ORDER — ROSUVASTATIN CALCIUM 20 MG PO TABS: 20.0000 mg | ORAL_TABLET | Freq: Every day | ORAL | 6 refills | Status: DC

## 2016-07-26 MED ORDER — LISINOPRIL 5 MG PO TABS: 5.0000 mg | ORAL_TABLET | Freq: Every day | ORAL | 6 refills | Status: DC

## 2016-07-26 NOTE — Progress Notes (Signed)
Cardiology Office Note   Date:  07/26/2016   ID:  Victor Delgado, DOB 07/08/1976, MRN [id number]  PCP:  Victor Delgado  Cardiologist:   Victor Siiffany Morton, Delgado   Chief Complaint  Patient presents with  . Follow-up    3 months;      History of Present Illness:  Victor Delgado is a 40 y.o. male with hypertension, hyperlipidemia and non-obstructive CAD who presents for follow up.  [name] was seen 03/03/16 with episodes of exertional chest pain.  He was referred for ETT a 03/17/16 and developed a rate-related LBBB.  He was also noted to have a hypertensive response to exercise.  [facility] that was negative for ischemia. He continued to be afraid that his chest pain was ischemia so he had a cardiac CT-A that showed <50% RCA stenosis.  Since that time he hasn't experienced any chest pain.  He started running last week and didn't have chest pain but did note shortness of breath.  He is very physically active at work and has felt well.  He completed a sleep study was negative for OSA. He was started on lisinopril 5 mg daily. He initially had lightheadedness and dizziness so this was reduced to 2.5 mg. However, this has resolved and he is back to taking 5 mg again.  He started taking rosuvastatin but developed headaches and stopped it.  He has been trying to limit fried foods, fatty foods and pork.    Past Medical History:  Diagnosis Date  . CAD (coronary artery disease) 07/26/2016  . Chest pain 03/09/2016  . Essential hypertension 04/26/2016  . Hyperlipidemia 04/26/2016  . Obesity (BMI 30-39.9) 04/26/2016    No past surgical history on file.   Current Outpatient Prescriptions  Medication Sig Dispense Refill  . aspirin 81 MG tablet Take 81 mg by mouth daily.    Marland Kitchen. lisinopril (PRINIVIL,ZESTRIL) 5 MG tablet Take 1 tablet (5 mg total) by mouth daily. 30 tablet 6  . nitroGLYCERIN (NITROSTAT) 0.4 MG SL tablet Place 1 tablet (0.4 mg total) under the tongue every 5  (five) minutes as needed for chest pain. 25 tablet prn  . rosuvastatin (CRESTOR) 20 MG tablet Take 1 tablet (20 mg total) by mouth daily. 30 tablet 6   No current facility-administered medications for this visit.     Allergies:   Crestor [rosuvastatin calcium]    Social History:  The patient  reports that he has never smoked. He has never used smokeless tobacco. He reports that he does not drink alcohol or use drugs.   Family History:  The patient's family history includes Diabetes in his mother; Heart attack in his paternal grandfather and paternal uncle; Hypertension in his mother; Stroke in his maternal grandfather and maternal grandmother.    ROS:  Please see the history of present illness.   Otherwise, review of systems are positive for none.   All other systems are reviewed and negative.    PHYSICAL EXAM: VS:  BP 123/86   Ht 6\' 1"  (1.854 m)   Wt 115.9 kg (255 lb 9.6 oz)   BMI 33.72 kg/m  , BMI Body mass index is 33.72 kg/m. GENERAL:  Well appearing HEENT:  Pupils equal round and reactive, fundi not visualized, oral mucosa unremarkable NECK:  No jugular venous distention, waveform within normal limits, carotid upstroke brisk and symmetric, no bruits LYMPHATICS:  No cervical adenopathy LUNGS:  Clear to auscultation bilaterally HEART:  RRR.  PMI  not displaced or sustained,S1 and S2 within normal limits, no S3, no S4, no clicks, no rubs, no murmurs ABD:  Flat, positive bowel sounds normal in frequency in pitch, no bruits, no rebound, no guarding, no midline pulsatile mass, no hepatomegaly, no splenomegaly EXT:  2 plus pulses throughout, no edema, no cyanosis no clubbing SKIN:  No rashes no nodules NEURO:  Cranial nerves II through XII grossly intact, motor grossly intact throughout PSYCH:  Cognitively intact, oriented to person place and time   EKG:  EKG is not ordered today. The ekg ordered 03/03/16 demonstrates sinus bradycardia rate 59 bpm.   Recent Labs: 04/26/2016: BUN  12; Creat 0.83; Potassium 4.1; Sodium 138 07/05/2016: ALT 35    Lipid Panel    Component Value Date/Time   CHOL 227 (H) 07/05/2016 0819   TRIG 125 07/05/2016 0819   HDL 48 07/05/2016 0819   CHOLHDL 4.7 07/05/2016 0819   VLDL 25 07/05/2016 0819   LDLCALC 154 (H) 07/05/2016 0819     01/20/19/17: Total cholesterol 234, tiglycerides 269, HDL 42,  LDL 139  01/20/15: Sodium 141, potassium 4, BUN 16, creatinine 0.9 AST 21, ALT 35  TSH 2.99  ETT 03/17/16:  Blood pressure demonstrated a hypertensive response to exercise.   ETT with duration of 6:26 sec; no chest pain or palpitations; patient developed probable rate related LBBB and therefore study terminated early (he did not reach THR); hypertensive response; suggest lexiscan nuclear study to screen for ischemia if indicated.  Lexiscan Myoview 03/28/16:  The left ventricular ejection fraction is mildly decreased (45-54%).  Nuclear stress EF: 52%.  No T wave inversion was noted during stress.  There was no ST segment deviation noted during stress.  This is a low risk study.   Wt Readings from Last 3 Encounters:  07/26/16 115.9 kg (255 lb 9.6 oz)  04/26/16 119 kg (262 lb 6.4 oz)  04/18/16 115.7 kg (255 lb)      ASSESSMENT AND PLAN:  # Non-obstructive CAD: Chest pain resolved. ETT and Lexiscan Myoview were negative for ischemia. Cardiac CTA revealed nonobstructive coronary disease.  Continue aspirin and resume rosuvastatin.  # Hypertension: Blood pressure is well-controlled. Continue lisinopril 5 mg daily.  # Hyperlipidemia:  [name]'s cholesterol levels are elevated. He has nonobstructive coronary artery disease. He did not tolerate rosuvastatin due to headaches. He was taking in the morning and we'll try taking in the evenings. This does not work she will reduce the dose to 10 mg daily. He continues to have  H headache we will switch to a different statin. Repeat lipids and CMP in 6 weeks.  Current medicines are  reviewed at length with the patient today.  The patient does not have concerns regarding medicines.  The following changes have been made:  Rosuvastatin 20mg  daily   Labs/ tests ordered today include:   Orders Placed This Encounter  Procedures  . Lipid Profile  . COMPLETE METABOLIC PANEL WITH GFR     Disposition:   FU with Joselyn Edling C. Duke Salviaandolph, Delgado, [facility] in 1 year.    This note was written with the assistance of speech recognition software.  Please excuse any transcriptional errors.   Signed, Amador Braddy C. Duke Salviaandolph, Delgado, [facility] HospitalFACC  07/26/2016 4:45 PM    [facility]

## 2016-07-26 NOTE — Patient Instructions (Signed)
Medication Instructions:  BEGIN TAKING CRESTOR (ROSUVASTATIN) 20 MG ONE TIME DAILY AT NIGHT.  Labwork: RETURN FOR BLOOD WORK IN 6 WEEKS (LIPIDS, CMP).  Testing/Procedures: NONE  Follow-Up: FOLLOW UP WITH DR. Darlington IN 1 YEAR.    If you need a refill on your cardiac medications before your next appointment, please call your pharmacy.

## 2016-09-05 ENCOUNTER — Telehealth: Payer: Self-pay | Admitting: Cardiovascular Disease

## 2016-09-05 NOTE — Telephone Encounter (Signed)
Please call,question about his Crestor.  °

## 2016-09-05 NOTE — Telephone Encounter (Signed)
Spoke with pt, he is having trouble with his knee and wonders if it is related to the crestor. Okay given for the patient to hold the crestor for 2 weeks. After 2 weeks if he has improvement , he will call and let us know. If no change in symptoms, Patient voiced understanding to restart the crestor.

## 2016-09-07 ENCOUNTER — Telehealth: Payer: Self-pay | Admitting: *Deleted

## 2016-09-07 NOTE — Telephone Encounter (Signed)
Patient did not have follow up lp/cmet secondary to holding Crestor, see phone note 1/30 He is to cal back with update

## 2016-09-13 NOTE — Telephone Encounter (Signed)
Spoke with patient and he continues to have pain in 1 knee only. Advised if pain continues after 1 week he needs to call PCP to get evaluated, call our office back for update

## 2016-10-02 ENCOUNTER — Telehealth: Payer: Self-pay | Admitting: Cardiovascular Disease

## 2016-10-02 DIAGNOSIS — E785 Hyperlipidemia, unspecified: Secondary | ICD-10-CM

## 2016-10-02 DIAGNOSIS — I10 Essential (primary) hypertension: Secondary | ICD-10-CM

## 2016-10-02 DIAGNOSIS — Z5181 Encounter for therapeutic drug level monitoring: Secondary | ICD-10-CM

## 2016-10-02 NOTE — Telephone Encounter (Signed)
Mr. Victor Delgado is calling states that he would like to switch his cholesterol medication, patient states that it gives headaches and he doesn't "feel right." Please call to discuss, thanks.

## 2016-10-02 NOTE — Telephone Encounter (Signed)
Spoke to patient Patient states he did not restart Crestor , after office appointment. He has been off medication for @1  month. Patient states he is feeling better not taking crestor  Patient would like to switch to another statin medication and recheck cholesterol 1 month later.   RN informed patient will defer to Dr Duke Salviaandolph and will contact with information

## 2016-10-03 NOTE — Telephone Encounter (Signed)
Let's try pravastatin 20 mg daily and repeat labs in 6 weeks.

## 2016-10-06 ENCOUNTER — Ambulatory Visit (INDEPENDENT_AMBULATORY_CARE_PROVIDER_SITE_OTHER): Payer: Self-pay | Admitting: Urgent Care

## 2016-10-06 ENCOUNTER — Ambulatory Visit (INDEPENDENT_AMBULATORY_CARE_PROVIDER_SITE_OTHER): Payer: BC Managed Care – PPO

## 2016-10-06 ENCOUNTER — Ambulatory Visit (INDEPENDENT_AMBULATORY_CARE_PROVIDER_SITE_OTHER): Payer: BC Managed Care – PPO | Admitting: Urgent Care

## 2016-10-06 VITALS — BP 120/84 | HR 58 | Temp 98.0°F | Resp 16 | Ht 73.0 in | Wt 258.0 lb

## 2016-10-06 VITALS — BP 122/72 | HR 72 | Temp 97.9°F | Resp 17 | Ht 73.0 in | Wt 258.0 lb

## 2016-10-06 DIAGNOSIS — E782 Mixed hyperlipidemia: Secondary | ICD-10-CM

## 2016-10-06 DIAGNOSIS — M25562 Pain in left knee: Secondary | ICD-10-CM | POA: Diagnosis not present

## 2016-10-06 DIAGNOSIS — I2510 Atherosclerotic heart disease of native coronary artery without angina pectoris: Secondary | ICD-10-CM

## 2016-10-06 DIAGNOSIS — Z024 Encounter for examination for driving license: Secondary | ICD-10-CM

## 2016-10-06 DIAGNOSIS — I10 Essential (primary) hypertension: Secondary | ICD-10-CM

## 2016-10-06 MED ORDER — NAPROXEN SODIUM 550 MG PO TABS: 550.0000 mg | ORAL_TABLET | Freq: Two times a day (BID) | ORAL | 1 refills | Status: DC

## 2016-10-06 NOTE — Progress Notes (Signed)
  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 The patient reports history of CAD, HTN, HL. CAD was diagnosed in 03/2016. His cardiologist Delgado Dr. Duke Delgado. Stress test was performed, patient started on lisinopril, Crestor. His next f/u was recommended for 03/2017. Has a history of appendectomy, left forearm fracture s/p surgical repair without sequelae. Has had sleep test to r/o sleep apnea which was negative. Denies smoking cigarettes or drinking alcohol. Denies dizziness, chronic headache, blurred vision, chest pain, shortness of breath, heart racing, palpitations, nausea, vomiting, abdominal pain, hematuria, lower leg swelling.   The following portions of the patient's history were reviewed and updated as appropriate: allergies, current medications, past family history, past medical history, past social history and past surgical history.  Objective:   BP 122/72 (BP Location: Right Arm, Patient Position: Sitting, Cuff Size: Normal)   Pulse 72   Temp 97.9 F (36.6 C) (Oral)   Resp 17   Ht 6\' 1"  (1.854 m)   Wt 258 lb (117 kg)   SpO2 97%   BMI 34.04 kg/m   Vision/hearing:  Visual Acuity Screening   Right eye Left eye Both eyes  Without correction: 20/20 20/20 20/20   With correction:     Hearing Screening Comments: Peripheral Vision: Right eye 85 degrees. Left eye 85 degrees. The patient can distinguish the colors red, amber and green. The patient was able to hear a forced whisper from L=10 R=10  feet.  Patient can recognize and distinguish among traffic control signals and devices showing standard red, green, and amber colors.  Corrective lenses required: No  Monocular Vision?: No  Hearing aid requirement: No  Physical Exam  Constitutional: He Delgado oriented to person, place, and time. He appears well-developed and well-nourished.  HENT:  TM's intact bilaterally, no effusions or erythema. Nasal turbinates  pink and moist, nasal passages patent. No sinus tenderness. Oropharynx clear, mucous membranes moist, dentition in good repair.  Eyes: Conjunctivae and EOM are normal. Pupils are equal, round, and reactive to light. Right eye exhibits no discharge. Left eye exhibits no discharge. No scleral icterus.  Neck: Normal range of motion. Neck supple. No thyromegaly present.  Cardiovascular: Normal rate, regular rhythm and intact distal pulses.  Exam reveals no gallop and no friction rub.   No murmur heard. Pulmonary/Chest: No stridor. No respiratory distress. He has no wheezes. He has no rales.  Abdominal: Soft. Bowel sounds are normal. He exhibits no distension and no mass. There Delgado no tenderness.  Musculoskeletal: Normal range of motion. He exhibits no edema or tenderness.  Lymphadenopathy:    He has no cervical adenopathy.  Neurological: He Delgado alert and oriented to person, place, and time. He has normal reflexes.  Skin: Skin Delgado warm and dry. No rash noted. No erythema. No pallor.  Psychiatric: He has a normal mood and affect.   Labs: Comments: SPGR:1.020 Pro:neg Glu:neg Blood:neg  Assessment:    Healthy male exam.  Meets standards, but periodic monitoring required due to CAD, HTN, HL.  Driver qualified only for 1 year.    Plan:   Medical examiners certificate completed and printed. Return as needed.  Victor BambergMario Matheson Vandehei, PA-C [facility] [phone] 10/06/2016  8:50 AM

## 2016-10-06 NOTE — Progress Notes (Signed)
  MRN: [id number] DOB: 04/24/1976  Subjective:   Victor Delgado is a 41 y.o. male presenting for chief complaint of Knee Pain (Left)  Reports 1 month history of persistent, intermittent left knee pain. Knee pain is constant, achy mostly with intermittent sharp pain, rated 4/10 on pain scale. Denies fever, redness, warmth, swelling, trauma, knee buckling, weakness, knee popping. Has tried ibuprofen occasionally without any relief. Denies history of knee injuries, knee surgeries. 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   [name] has a current medication list which includes the following prescription(s): aspirin, lisinopril, and nitroglycerin. Also is allergic to crestor [rosuvastatin calcium].  [name]  has a past medical history of CAD (coronary artery disease) (07/26/2016); Chest pain (03/09/2016); Essential hypertension (04/26/2016); Hyperlipidemia (04/26/2016); and Obesity (BMI 30-39.9) (04/26/2016). Also  has no past surgical history on file.  Objective:   Vitals: BP 120/84   Pulse (!) 58   Temp 98 F (36.7 C) (Oral)   Resp 16   Ht 6\' 1"  (1.854 m)   Wt 258 lb (117 kg)   SpO2 98%   BMI 34.04 kg/m   Physical Exam  Constitutional: He is oriented to person, place, and time. He appears well-developed and well-nourished.  Cardiovascular: Normal rate.   Pulmonary/Chest: Effort normal.  Musculoskeletal:       Left knee: He exhibits normal range of motion, no swelling, no effusion, no ecchymosis, no deformity, no laceration, no erythema, normal alignment, normal patellar mobility, no bony tenderness and normal meniscus. No tenderness found.  Neurological: He is alert and oriented to person, place, and time.  Skin: Skin is warm and dry.   Dg Knee Complete 4 Views Left  Result Date: 10/06/2016 CLINICAL DATA:  Acute pain.  No known injury . EXAM: LEFT KNEE - COMPLETE 4+ VIEW COMPARISON:  No recent prior. FINDINGS: No acute bony or joint abnormality. No evidence of  fracture or dislocation. IMPRESSION: No acute or focal abnormality. Electronically Signed   By: Maisie Fushomas  Register   On: 10/06/2016 10:51   Assessment and Plan :   1. Acute pain of left knee - Will have patient start on conservative management. Use Anaprox BID with meals. Use icing, knee wrap. Recheck in 1-2 weeks if no improvement.  Wallis BambergMario Cariann Kinnamon, PA-C [facility] [phone] 10/06/2016  9:39 AM

## 2016-10-06 NOTE — Patient Instructions (Addendum)
Knee Pain, Adult Knee pain in adults is common. It can be caused by many things, including:  Arthritis.  A fluid-filled sac (cyst) or growth in your knee.  An infection in your knee.  An injury that will not heal.  Damage, swelling, or irritation of the tissues that support your knee. Knee pain is usually not a sign of a serious problem. The pain may go away on its own with time and rest. If it does not, a health care provider may order tests to find the cause of the pain. These may include:  Imaging tests, such as an X-ray, MRI, or ultrasound.  Joint aspiration. In this test, fluid is removed from the knee.  Arthroscopy. In this test, a lighted tube is inserted into knee and an image is projected onto a TV screen.  A biopsy. In this test, a sample of tissue is removed from the body and studied under a microscope. Follow these instructions at home: Pay attention to any changes in your symptoms. Take these actions to relieve your pain. Activity   Rest your knee.  Do not do things that cause pain or make pain worse.  Avoid high-impact activities or exercises, such as running, jumping rope, or doing jumping jacks. General instructions   Take over-the-counter and prescription medicines only as told by your health care provider.  Raise (elevate) your knee above the level of your heart when you are sitting or lying down.  Sleep with a pillow under your knee.  If directed, apply ice to the knee:  Put ice in a plastic bag.  Place a towel between your skin and the bag.  Leave the ice on for 20 minutes, 2-3 times a day.  Ask your health care provider if you should wear an elastic knee support.  Lose weight if you are overweight. Extra weight can put pressure on your knee.  Do not use any products that contain nicotine or tobacco, such as cigarettes and e-cigarettes. Smoking may slow the healing of any bone and joint problems that you may have. If you need help quitting, ask  your health care provider. Contact a health care provider if:  Your knee pain continues, changes, or gets worse.  You have a fever along with knee pain.  Your knee buckles or locks up.  Your knee swells, and the swelling becomes worse. Get help right away if:  Your knee feels warm to the touch.  You cannot move your knee.  You have severe pain in your knee.  You have chest pain.  You have trouble breathing. Summary  Knee pain in adults is common. It can be caused by many things, including, arthritis, infection, cysts, or injury.  Knee pain is usually not a sign of a serious problem, but if it does not go away, a health care provider may perform tests to know the cause of the pain.  Pay attention to any changes in your symptoms. Relieve your pain with rest, medicines, light activity, and use of ice.  Get help if your pain continues or becomes very severe, or if your knee buckles or locks up, or if you have chest pain or trouble breathing. This information is not intended to replace advice given to you by your health care provider. Make sure you discuss any questions you have with your health care provider. Document Released: 05/21/2007 Document Revised: 07/14/2016 Document Reviewed: 07/14/2016 Elsevier Interactive Patient Education  2017 [employer].     IF you received an x-ray  today, you will receive an invoice from [facility]. Please contact [facility] at [phone] with questions or concerns regarding your invoice.   IF you received labwork today, you will receive an invoice from [facility]. Please contact [facility] at [phone] with questions or concerns regarding your invoice.   Our billing staff will not be able to assist you with questions regarding bills from these companies.  You will be contacted with the lab results as soon as they are available. The fastest way to get your results is to activate your My Chart account. Instructions are  located on the last page of this paperwork. If you have not heard from us regarding the results in 2 weeks, please contact this office.

## 2016-10-06 NOTE — Patient Instructions (Signed)
     IF you received an x-ray today, you will receive an invoice from [facility]. Please contact [facility] at [phone] with questions or concerns regarding your invoice.   IF you received labwork today, you will receive an invoice from [facility]. Please contact [facility] at [phone] with questions or concerns regarding your invoice.   Our billing staff will not be able to assist you with questions regarding bills from these companies.  You will be contacted with the lab results as soon as they are available. The fastest way to get your results is to activate your My Chart account. Instructions are located on the last page of this paperwork. If you have not heard from us regarding the results in 2 weeks, please contact this office.     

## 2016-10-11 ENCOUNTER — Encounter: Payer: Self-pay | Admitting: Cardiovascular Disease

## 2016-10-11 MED ORDER — PRAVASTATIN SODIUM 20 MG PO TABS: 20.0000 mg | ORAL_TABLET | Freq: Every day | ORAL | 5 refills | Status: DC

## 2016-10-11 NOTE — Telephone Encounter (Signed)
Advised patient, verbalized understanding  

## 2016-10-11 NOTE — Telephone Encounter (Signed)
New message  ° ° °Patient calling back to speak with nurse  °

## 2016-10-11 NOTE — Telephone Encounter (Signed)
Left message to call back  

## 2016-10-11 NOTE — Telephone Encounter (Signed)
This encounter was created in error - please disregard.

## 2016-11-08 LAB — COMPREHENSIVE METABOLIC PANEL
ALBUMIN: 4.4 g/dL (ref 3.6–5.1)
ALK PHOS: 55 U/L (ref 40–115)
ALT: 28 U/L (ref 9–46)
AST: 19 U/L (ref 10–40)
BUN: 17 mg/dL (ref 7–25)
CO2: 28 mmol/L (ref 20–31)
CREATININE: 0.87 mg/dL (ref 0.60–1.35)
Calcium: 9.1 mg/dL (ref 8.6–10.3)
Chloride: 105 mmol/L (ref 98–110)
Glucose, Bld: 92 mg/dL (ref 65–99)
POTASSIUM: 4.1 mmol/L (ref 3.5–5.3)
SODIUM: 141 mmol/L (ref 135–146)
Total Bilirubin: 0.6 mg/dL (ref 0.2–1.2)
Total Protein: 6.9 g/dL (ref 6.1–8.1)

## 2016-11-08 LAB — LIPID PANEL
CHOLESTEROL: 173 mg/dL (ref <200)
HDL: 46 mg/dL (ref >40)
LDL Cholesterol: 108 mg/dL — ABNORMAL HIGH (ref <100)
Total CHOL/HDL Ratio: 3.8 Ratio (ref <5.0)
Triglycerides: 93 mg/dL (ref <150)
VLDL: 19 mg/dL (ref <30)

## 2016-11-09 ENCOUNTER — Telehealth: Payer: Self-pay | Admitting: *Deleted

## 2016-11-09 DIAGNOSIS — E669 Obesity, unspecified: Secondary | ICD-10-CM

## 2016-11-09 DIAGNOSIS — E785 Hyperlipidemia, unspecified: Secondary | ICD-10-CM

## 2016-11-09 MED ORDER — PRAVASTATIN SODIUM 40 MG PO TABS: 40.0000 mg | ORAL_TABLET | Freq: Every day | ORAL | 3 refills | Status: DC

## 2016-11-09 NOTE — Telephone Encounter (Signed)
-----   Message from Chilton Si, MD sent at 11/09/2016 12:06 PM EDT ----- Cholesterol is much better but could be better given that he has CAD.  Increase pravastatin to 40 mg if he is feeling OK.  Repeat lipid and CMP in 6 weeks.

## 2016-11-09 NOTE — Telephone Encounter (Signed)
Spoke to patient. Result given . Verbalized understanding Patient states he has little arm discomfort not sure if it is the medication or not but willing to increase pravastatin to 40 mg E-sent prescription and mail lab slip

## 2016-11-28 ENCOUNTER — Telehealth: Payer: Self-pay | Admitting: Cardiovascular Disease

## 2016-11-28 NOTE — Telephone Encounter (Signed)
The patient returned the phone call and stated that he would try the Rosuvastatin 10 mg twice a week (Monday and Friday) and would discontinue the Pravastatin. He still has some left over so he stated that he would split his 20 mg tablet in half. He stated that he would call back if needed.

## 2016-11-28 NOTE — Telephone Encounter (Signed)
His labs looked closer to goal on the pravastatin.  He previously was on rosuvastatin 20 mg daily, but stated this caused headaches.  Would he be willing to try rosuvastatin 10 mg (1/2 of the 20 mg tab if he still has them) and take only twice weekly?

## 2016-11-28 NOTE — Telephone Encounter (Signed)
Left message to call back, per DPR. 

## 2016-11-28 NOTE — Telephone Encounter (Signed)
New Message ° °Pt voiced wanting to speak with nurse. ° ° °

## 2016-11-28 NOTE — Telephone Encounter (Signed)
Returned the call to the patient. He stated that the Pravastatin 40 mg was giving him muscle pains and wants to know what else he can take. He had taken rosuvastatin previously which caused him headaches.

## 2017-01-03 LAB — COMPREHENSIVE METABOLIC PANEL
ALBUMIN: 4.3 g/dL (ref 3.6–5.1)
ALT: 35 U/L (ref 9–46)
AST: 22 U/L (ref 10–40)
Alkaline Phosphatase: 61 U/L (ref 40–115)
BILIRUBIN TOTAL: 0.6 mg/dL (ref 0.2–1.2)
BUN: 14 mg/dL (ref 7–25)
CHLORIDE: 102 mmol/L (ref 98–110)
CO2: 29 mmol/L (ref 20–31)
CREATININE: 0.95 mg/dL (ref 0.60–1.35)
Calcium: 9.4 mg/dL (ref 8.6–10.3)
GLUCOSE: 82 mg/dL (ref 65–99)
Potassium: 4.5 mmol/L (ref 3.5–5.3)
SODIUM: 138 mmol/L (ref 135–146)
Total Protein: 7 g/dL (ref 6.1–8.1)

## 2017-01-03 LAB — LIPID PANEL
CHOL/HDL RATIO: 3.3 ratio (ref <5.0)
Cholesterol: 163 mg/dL (ref <200)
HDL: 49 mg/dL (ref >40)
LDL CALC: 96 mg/dL (ref <100)
Triglycerides: 88 mg/dL (ref <150)
VLDL: 18 mg/dL (ref <30)

## 2017-02-23 ENCOUNTER — Encounter: Payer: Self-pay | Admitting: Physician Assistant

## 2017-02-23 ENCOUNTER — Ambulatory Visit (INDEPENDENT_AMBULATORY_CARE_PROVIDER_SITE_OTHER): Payer: BC Managed Care – PPO | Admitting: Physician Assistant

## 2017-02-23 VITALS — BP 125/80 | HR 62 | Temp 98.0°F | Resp 18 | Ht 73.03 in | Wt 260.4 lb

## 2017-02-23 DIAGNOSIS — R05 Cough: Secondary | ICD-10-CM | POA: Diagnosis not present

## 2017-02-23 DIAGNOSIS — J069 Acute upper respiratory infection, unspecified: Secondary | ICD-10-CM | POA: Diagnosis not present

## 2017-02-23 DIAGNOSIS — R0981 Nasal congestion: Secondary | ICD-10-CM | POA: Diagnosis not present

## 2017-02-23 DIAGNOSIS — J029 Acute pharyngitis, unspecified: Secondary | ICD-10-CM | POA: Diagnosis not present

## 2017-02-23 DIAGNOSIS — R059 Cough, unspecified: Secondary | ICD-10-CM

## 2017-02-23 LAB — POCT RAPID STREP A (OFFICE): Rapid Strep A Screen: NEGATIVE

## 2017-02-23 MED ORDER — FLUTICASONE PROPIONATE 50 MCG/ACT NA SUSP: 2.0000 | Freq: Every day | NASAL | 6 refills | Status: DC

## 2017-02-23 MED ORDER — MAGIC MOUTHWASH W/LIDOCAINE: 10.0000 mL | ORAL | 0 refills | Status: DC | PRN

## 2017-02-23 MED ORDER — BENZONATATE 100 MG PO CAPS: 100.0000 mg | ORAL_CAPSULE | Freq: Three times a day (TID) | ORAL | 0 refills | Status: DC | PRN

## 2017-02-23 NOTE — Patient Instructions (Addendum)
- Your rapid strep test was negative. We have sent it off for culture and will have it in about 3 days. While awaiting results,we will treat this as a respiratory viral infection.  - I recommend you rest, drink plenty of fluids, eat light meals including soups and smoothies. - You may use Tessalon pearls for cough. If you develop a cough at night that disrupts your sleep, we can call you in a cough syrup.  - You may also magic mouthwash and either Tylenol or ibuprofen over-the-counter for your sore throat. I recommend dosing the tylenol or ibuprofen throughout the day as prescribed until pain resolves. - Please let me know if you are not seeing any improvement or get worse in 3-5 days. Return next week for further evaluation of your rash. Thank you for letting me participate in your health and well being.  Upper Respiratory Infection, Adult Most upper respiratory infections (URIs) are caused by a virus. A URI affects the nose, throat, and upper air passages. The most common type of URI is often called "the common cold." Follow these instructions at home:  Take medicines only as told by your doctor.  Gargle warm saltwater or take cough drops to comfort your throat as told by your doctor.  Use a warm mist humidifier or inhale steam from a shower to increase air moisture. This may make it easier to breathe.  Drink enough fluid to keep your pee (urine) clear or pale yellow.  Eat soups and other clear broths.  Have a healthy diet.  Rest as needed.  Go back to work when your fever is gone or your doctor says it is okay. ? You may need to stay home longer to avoid giving your URI to others. ? You can also wear a face mask and wash your hands often to prevent spread of the virus.  Use your inhaler more if you have asthma.  Do not use any tobacco products, including cigarettes, chewing tobacco, or electronic cigarettes. If you need help quitting, ask your doctor. Contact a doctor if:  You are  getting worse, not better.  Your symptoms are not helped by medicine.  You have chills.  You are getting more short of breath.  You have brown or red mucus.  You have yellow or brown discharge from your nose.  You have pain in your face, especially when you bend forward.  You have a fever.  You have puffy (swollen) neck glands.  You have pain while swallowing.  You have white areas in the back of your throat. Get help right away if:  You have very bad or constant: ? Headache. ? Ear pain. ? Pain in your forehead, behind your eyes, and over your cheekbones (sinus pain). ? Chest pain.  You have long-lasting (chronic) lung disease and any of the following: ? Wheezing. ? Long-lasting cough. ? Coughing up blood. ? A change in your usual mucus.  You have a stiff neck.  You have changes in your: ? Vision. ? Hearing. ? Thinking. ? Mood. This information is not intended to replace advice given to you by your health care provider. Make sure you discuss any questions you have with your health care provider. Document Released: 01/10/2008 Document Revised: 03/26/2016 Document Reviewed: 10/29/2013 Elsevier Interactive Patient Education  2018 [employer]       IF you received an x-ray today, you will receive an invoice from [facility]. Please contact [facility] at [phone] with questions or concerns regarding your  invoice.   IF you received labwork today, you will receive an invoice from [facility]. Please contact [facility] at [phone] with questions or concerns regarding your invoice.   Our billing staff will not be able to assist you with questions regarding bills from these companies.  You will be contacted with the lab results as soon as they are available. The fastest way to get your results is to activate your My Chart account. Instructions are located on the last page of this paperwork. If you have not heard from [place] regarding the results in  2 weeks, please contact this office.

## 2017-02-23 NOTE — Progress Notes (Signed)
MRN: [id number] DOB: May 11, 1976  Subjective:   Victor Delgado is a 41 y.o. male presenting for chief complaint of Sore Throat (X 2 days); Nasal Congestion (X 2 days); and Rash (X 2- 3 mths right foot ankle) .  Reports 2 day history of rhinorrhea, sore throat and mild dry cough. Has tried dayquil and nyquil with no full relief. Denies fever, itchy watery eyes, ear pain, wheezing, shortness of breath, chest tightness and myalgia, night sweats, chills, nausea, vomiting, abdominal pain and diarrhea. Has had sick contact with wife. No history of seasonal allergies, no history of asthma.  Denies smoking.   Victor Delgado has a current medication list which includes the following prescription(s): aspirin, lisinopril, naproxen sodium, nitroglycerin, and rosuvastatin. Also is allergic to crestor [rosuvastatin calcium].  Victor Delgado  has a past medical history of CAD (coronary artery disease) (07/26/2016); Chest pain (03/09/2016); Essential hypertension (04/26/2016); Hyperlipidemia (04/26/2016); and Obesity (BMI 30-39.9) (04/26/2016). Also  has no past surgical history on file.   Objective:   Vitals: BP 125/80 (BP Location: Right Arm, Patient Position: Sitting, Cuff Size: Large)   Pulse 62   Temp 98 F (36.7 C) (Oral)   Resp 18   Ht 6' 1.03" (1.855 m)   Wt 260 lb 6.4 oz (118.1 kg)   SpO2 98%   BMI 34.33 kg/m   Physical Exam  Constitutional: He is oriented to person, place, and time. He appears well-developed and well-nourished.  HENT:  Head: Normocephalic and atraumatic.  Right Ear: Tympanic membrane, external ear and ear canal normal.  Left Ear: Tympanic membrane, external ear and ear canal normal.  Nose: Mucosal edema present. Right sinus exhibits no maxillary sinus tenderness and no frontal sinus tenderness. Left sinus exhibits no maxillary sinus tenderness and no frontal sinus tenderness.  Mouth/Throat: Uvula is midline and mucous membranes are normal. Posterior oropharyngeal erythema present. Tonsils are  1+ on the right. Tonsils are 1+ on the left. Tonsillar exudate (mild noted bilaterally).  Eyes: Conjunctivae are normal.  Neck: Normal range of motion.  Pulmonary/Chest: Effort normal.  Lymphadenopathy:       Head (right side): No submental, no submandibular, no tonsillar, no preauricular, no posterior auricular and no occipital adenopathy present.       Head (left side): No submental, no submandibular, no tonsillar, no preauricular, no posterior auricular and no occipital adenopathy present.    He has no cervical adenopathy.       Right: No supraclavicular adenopathy present.       Left: No supraclavicular adenopathy present.  Neurological: He is alert and oriented to person, place, and time.  Skin: Skin is warm and dry.  Psychiatric: He has a normal mood and affect.  Vitals reviewed.   Results for orders placed or performed in visit on 02/23/17 (from the past 24 hour(s))  POCT rapid strep A     Status: None   Collection Time: 02/23/17  4:57 PM  Result Value Ref Range   Rapid Strep A Screen Negative Negative    Assessment and Plan :   1. Sore throat - POCT rapid strep A - Culture, Group A Strep - magic mouthwash w/lidocaine SOLN; Take 10 mLs by mouth every 2 (two) hours as needed for mouth pain.  Dispense: 360 mL; Refill: 0 2. Cough - benzonatate (TESSALON) 100 MG capsule; Take 1-2 capsules (100-200 mg total) by mouth 3 (three) times daily as needed for cough.  Dispense: 40 capsule; Refill: 0 3. Nasal congestion - fluticasone (FLONASE) 50 MCG/ACT  nasal spray; Place 2 sprays into both nostrils daily.  Dispense: 16 g; Refill: 6 4. Acute upper respiratory infection Vitals stable, rapid strep negative, culture pending. Likely viral etiology. Will treat symptomatically at this time. Pt encouraged to -Return to clinic if symptoms worsen, do not improve in 3-5 days, or as needed  Benjiman CoreBrittany Gabriellah Rabel, PA-C  Primary Care at [facility] 02/25/2017 8:54 PM

## 2017-02-26 LAB — CULTURE, GROUP A STREP

## 2017-02-28 ENCOUNTER — Ambulatory Visit (INDEPENDENT_AMBULATORY_CARE_PROVIDER_SITE_OTHER): Payer: BC Managed Care – PPO | Admitting: Physician Assistant

## 2017-02-28 ENCOUNTER — Telehealth: Payer: Self-pay | Admitting: Physician Assistant

## 2017-02-28 ENCOUNTER — Other Ambulatory Visit: Payer: Self-pay | Admitting: Physician Assistant

## 2017-02-28 ENCOUNTER — Encounter: Payer: Self-pay | Admitting: Physician Assistant

## 2017-02-28 VITALS — BP 110/72 | HR 68 | Temp 98.0°F | Resp 18 | Ht 72.8 in | Wt 255.6 lb

## 2017-02-28 DIAGNOSIS — R21 Rash and other nonspecific skin eruption: Secondary | ICD-10-CM

## 2017-02-28 DIAGNOSIS — R05 Cough: Secondary | ICD-10-CM | POA: Diagnosis not present

## 2017-02-28 DIAGNOSIS — J029 Acute pharyngitis, unspecified: Secondary | ICD-10-CM | POA: Diagnosis not present

## 2017-02-28 DIAGNOSIS — R059 Cough, unspecified: Secondary | ICD-10-CM

## 2017-02-28 LAB — POCT URINALYSIS DIP (MANUAL ENTRY)
Bilirubin, UA: NEGATIVE
Glucose, UA: NEGATIVE mg/dL
Leukocytes, UA: NEGATIVE
NITRITE UA: NEGATIVE
SPEC GRAV UA: 1.025 (ref 1.010–1.025)
UROBILINOGEN UA: 1 U/dL
pH, UA: 5.5 (ref 5.0–8.0)

## 2017-02-28 MED ORDER — HYDROCODONE-HOMATROPINE 5-1.5 MG/5ML PO SYRP: 5.0000 mL | ORAL_SOLUTION | Freq: Three times a day (TID) | ORAL | 0 refills | Status: DC | PRN

## 2017-02-28 MED ORDER — AMOXICILLIN 500 MG PO CAPS: 500.0000 mg | ORAL_CAPSULE | Freq: Two times a day (BID) | ORAL | 0 refills | Status: AC

## 2017-02-28 NOTE — Progress Notes (Deleted)
Subjective:     Victor Delgado is a 41 y.o. male who presents for evaluation of a rash involving the {body part:32401}. Rash started {1-10:13787} {time; units:19136} ago. Lesions are {color:5006}, and {text:16500} in texture. Rash {has/not:18111} changed over time. Rash {rash dc:16501}. Associated symptoms: {rash assoc:16502}. Patient denies: {rash assoc:19565}. Patient {has/not:18111} had contacts with similar rash. Patient {has/not:18111} had new exposures (soaps, lotions, laundry detergents, foods, medications, plants, insects or animals).  {Common ambulatory SmartLinks:19316}  Review of Systems {ros; complete:30496}    Objective:    BP 110/72 (BP Location: Right Arm, Patient Position: Sitting, Cuff Size: Large)   Pulse 68   Temp 98 F (36.7 C) (Oral)   Resp 18   Ht 6' 0.8" (1.849 m)   Wt 255 lb 9.6 oz (115.9 kg)   SpO2 97%   BMI 33.91 kg/m  General:  {gen appearance:16600}  Skin:  {skin exam:30902::"normal"}     Assessment:    {derm diagnosis:16511}    Plan:    {BJYN:82956}{plan:18774}

## 2017-02-28 NOTE — Progress Notes (Signed)
Victor Delgado  MRN: [id number] DOB: 01/14/1976  Subjective:   Victor Delgado is a 41 y.o. male who presents for evaluation of a rash involving the right and left ankle. Rash started 1 year ago. Lesions are dark, and flat in texture. Rash has changed over time. Rash causes no discomfort. Associated symptoms: none. Patient denies: abdominal pain, arthralgia, decrease in energy level, fever, headache, irritability, myalgia, nausea and vomiting. Patient has not had contacts with similar rash. Patient has not had new exposures (soaps, lotions, laundry detergents, foods, medications, plants, insects or animals). Last CMP on 01/03/17 showed normal kidney function and  liver enzymes.   Pt's cough has also gotten worse since his last visit on 02/23/17. Notes it has been more constant throughout the day and is now keeping him up at night. His sore throat is still present. Denies fever, chills, shortness of breath, sputum production, and wheezing. Has tried some of his wife's meds for relief but it did not help.   Review of Systems Per HPI  Patient Active Problem List   Diagnosis Date Noted  . CAD (coronary artery disease) 07/26/2016  . Essential hypertension 04/26/2016  . Hyperlipidemia 04/26/2016  . Obesity (BMI 30-39.9) 04/26/2016  . Chest pain 03/09/2016    Current Outpatient Prescriptions on File Prior to Visit  Medication Sig Dispense Refill  . aspirin 81 MG tablet Take 81 mg by mouth daily.    . benzonatate (TESSALON) 100 MG capsule Take 1-2 capsules (100-200 mg total) by mouth 3 (three) times daily as needed for cough. 40 capsule 0  . fluticasone (FLONASE) 50 MCG/ACT nasal spray Place 2 sprays into both nostrils daily. 16 g 6  . lisinopril (PRINIVIL,ZESTRIL) 5 MG tablet Take 1 tablet (5 mg total) by mouth daily. 30 tablet 6  . magic mouthwash w/lidocaine SOLN Take 10 mLs by mouth every 2 (two) hours as needed for mouth pain. 360 mL 0  . naproxen sodium (ANAPROX DS) 550 MG tablet Take 1 tablet  (550 mg total) by mouth 2 (two) times daily with a meal. (Patient not taking: Reported on 02/23/2017) 30 tablet 1  . nitroGLYCERIN (NITROSTAT) 0.4 MG SL tablet Place 1 tablet (0.4 mg total) under the tongue every 5 (five) minutes as needed for chest pain. 25 tablet prn  . rosuvastatin (CRESTOR) 10 MG tablet Take 10 mg by mouth 2 (two) times a week.     No current facility-administered medications on file prior to visit.     Allergies  Allergen Reactions  . Crestor [Rosuvastatin Calcium]     Headaches       Social History   Social History  . Marital status: Married    Spouse name: N/A  . Number of children: N/A  . Years of education: N/A   Occupational History  . Not on file.   Social History Main Topics  . Smoking status: Never Smoker  . Smokeless tobacco: Never Used  . Alcohol use No  . Drug use: No  . Sexual activity: Yes   Other Topics Concern  . Not on file   Social History Narrative  . No narrative on file    Objective:  BP 110/72 (BP Location: Right Arm, Patient Position: Sitting, Cuff Size: Large)   Pulse 68   Temp 98 F (36.7 C) (Oral)   Resp 18   Ht 6' 0.8" (1.849 m)   Wt 255 lb 9.6 oz (115.9 kg)   SpO2 97%   BMI 33.91 kg/m  Physical Exam  Constitutional: He is oriented to person, place, and time and well-developed, well-nourished, and in no distress.  HENT:  Head: Normocephalic and atraumatic.  Mouth/Throat: Uvula is midline and mucous membranes are normal. Posterior oropharyngeal erythema present.  Eyes: Conjunctivae are normal.  Neck: Normal range of motion.  Cardiovascular: Normal rate and regular rhythm.   Pulses:      Dorsalis pedis pulses are 2+ on the right side, and 2+ on the left side.       Posterior tibial pulses are 2+ on the right side, and 2+ on the left side.  Pulmonary/Chest: Effort normal and breath sounds normal. He has no wheezes. He has no rhonchi. He has no rales.  Neurological: He is alert and oriented to person, place, and  time. Gait normal.  Skin: Skin is warm and dry.  Hyperpigmented macular lesions noted on medial and lateral aspect of right and left ankle. More pronounced on right ankle.   Psychiatric: Affect normal.  Vitals reviewed.  Results for orders placed or performed in visit on 02/28/17 (from the past 24 hour(s))  POCT urinalysis dipstick     Status: Abnormal   Collection Time: 02/28/17  6:24 PM  Result Value Ref Range   Color, UA yellow yellow   Clarity, UA clear clear   Glucose, UA negative negative mg/dL   Bilirubin, UA negative negative   Ketones, POC UA trace (5) (A) negative mg/dL   Spec Grav, UA [id number] [id number] - 1.025   Blood, UA trace-intact (A) negative   pH, UA 5.5 5.0 - 8.0   Protein Ur, POC trace (A) negative mg/dL   Urobilinogen, UA 1.0 0.2 or 1.0 E.U./dL   Nitrite, UA Negative Negative   Leukocytes, UA Negative Negative     Assessment and Plan :   1. Rash and nonspecific skin eruption Consistent with normal skin changes. Labs pending.  - CBC with Differential/Platelet - Iron, TIBC and Ferritin Panel - POCT urinalysis dipstick -UA, microscopic  2. Cough Lung exam clear. Will treat with cough syrup.  - HYDROcodone-homatropine (HYCODAN) 5-1.5 MG/5ML syrup; Take 5 mLs by mouth every 8 (eight) hours as needed for cough.  Dispense: 120 mL; Refill: 0 3. Sore throat Culture returned with positive beta hemolytic colonies. I have already eprescribed amoxicillin earlier today. Pt has taken one dose. Instructed to continue it until completion.   Benjiman CoreBrittany Reena Borromeo, PA-C  Primary Care at [facility] 02/28/2017 6:27 PM

## 2017-02-28 NOTE — Telephone Encounter (Signed)
Called pt. No answer. Number has been disconnected.

## 2017-02-28 NOTE — Patient Instructions (Addendum)
We will contact you with your lab results from today sometime next week.   For the sore throat, please take antibiotic as prescribed. For the cough, use the cough. Be aware that cough syrup can definitely make you drowsy and sleepy so do not drive or operate any heavy machinery if it is affecting you during the day.    IF you received an x-ray today, you will receive an invoice from [facility]. Please contact [facility] at [phone] with questions or concerns regarding your invoice.   IF you received labwork today, you will receive an invoice from [facility]. Please contact [facility] at [phone] with questions or concerns regarding your invoice.   Our billing staff will not be able to assist you with questions regarding bills from these companies.  You will be contacted with the lab results as soon as they are available. The fastest way to get your results is to activate your My Chart account. Instructions are located on the last page of this paperwork. If you have not heard from us regarding the results in 2 weeks, please contact this office.

## 2017-02-28 NOTE — Progress Notes (Signed)
Meds ordered this encounter  Medications  . amoxicillin (AMOXIL) 500 MG capsule    Sig: Take 1 capsule (500 mg total) by mouth 2 (two) times daily.    Dispense:  20 capsule    Refill:  0    Order Specific Question:   Supervising Provider    Answer:   SMITH, KRISTI M [[id number]]    

## 2017-03-01 LAB — IRON,TIBC AND FERRITIN PANEL
FERRITIN: 294 ng/mL (ref 30–400)
IRON SATURATION: 10 % — AB (ref 15–55)
IRON: 29 ug/dL — AB (ref 38–169)
Total Iron Binding Capacity: 297 ug/dL (ref 250–450)
UIBC: 268 ug/dL (ref 111–343)

## 2017-03-01 LAB — CBC WITH DIFFERENTIAL/PLATELET
BASOS: 0 %
Basophils Absolute: 0 10*3/uL (ref 0.0–0.2)
EOS (ABSOLUTE): 0.3 10*3/uL (ref 0.0–0.4)
EOS: 3 %
HEMATOCRIT: 46.9 % (ref 37.5–51.0)
HEMOGLOBIN: 15.9 g/dL (ref 13.0–17.7)
IMMATURE GRANS (ABS): 0.1 10*3/uL (ref 0.0–0.1)
IMMATURE GRANULOCYTES: 1 %
LYMPHS: 21 %
Lymphocytes Absolute: 1.9 10*3/uL (ref 0.7–3.1)
MCH: 31.2 pg (ref 26.6–33.0)
MCHC: 33.9 g/dL (ref 31.5–35.7)
MCV: 92 fL (ref 79–97)
Monocytes Absolute: 0.8 10*3/uL (ref 0.1–0.9)
Monocytes: 8 %
NEUTROS ABS: 6.1 10*3/uL (ref 1.4–7.0)
NEUTROS PCT: 67 %
Platelets: 326 10*3/uL (ref 150–379)
RBC: 5.09 x10E6/uL (ref 4.14–5.80)
RDW: 13.2 % (ref 12.3–15.4)
WBC: 9.1 10*3/uL (ref 3.4–10.8)

## 2017-03-01 LAB — URINALYSIS, MICROSCOPIC ONLY: Casts: NONE SEEN /lpf

## 2017-06-04 ENCOUNTER — Other Ambulatory Visit: Payer: Self-pay | Admitting: Cardiovascular Disease

## 2017-06-04 NOTE — Telephone Encounter (Signed)
Please review for refill, Thanks !  

## 2017-08-13 ENCOUNTER — Other Ambulatory Visit: Payer: Self-pay | Admitting: Cardiovascular Disease

## 2017-08-13 NOTE — Telephone Encounter (Signed)
Refill Request.  

## 2017-10-12 ENCOUNTER — Ambulatory Visit (INDEPENDENT_AMBULATORY_CARE_PROVIDER_SITE_OTHER): Payer: Self-pay | Admitting: Physician Assistant

## 2017-10-12 ENCOUNTER — Encounter: Payer: Self-pay | Admitting: Physician Assistant

## 2017-10-12 ENCOUNTER — Other Ambulatory Visit: Payer: Self-pay

## 2017-10-12 VITALS — BP 130/88 | HR 93 | Temp 98.8°F | Resp 18 | Ht 72.44 in | Wt 271.4 lb

## 2017-10-12 DIAGNOSIS — Z024 Encounter for examination for driving license: Secondary | ICD-10-CM

## 2017-10-12 NOTE — Progress Notes (Signed)
This patient presents for DOT examination for fitness for duty.  Patient Active Problem List   Diagnosis Date Noted  . CAD (coronary artery disease) 07/26/2016  . Essential hypertension 04/26/2016  . Hyperlipidemia 04/26/2016  . Obesity (BMI 30-39.9) 04/26/2016  . Chest pain 03/09/2016   Low risk study exercise stress test - 8/17 Neg mycardial perfusion imaging - 8/17  Sleep study - no sleep apnea 9/17  Never used his nitroglycerin  Medical History:  no  1. Head/Brain Injuries, disorders or illnesses no  2. Seizures, epilepsy no  3. Eye disorders or impaired vision (except corrective lenses) no  4. Ear disorders, loss of hearing or balance yes  5. Heart disease or heart attack, other cardiovascular condition no  6. Heart surgery (valve replacement/bypass, angioplasty, pacemaker/defribrillator) yes  7. High blood pressure yes  8. High holesterol no  9. Chronic cough, shortness of breath or other breathing problems no  10. Lung disease (emphysema, asthma or chronic bronchitis) no  11. Kidney disease, dialysis no  12. Digestive problems  no  13. Diabetes or elevated blood sugar  no  Insulin use no  14. Nervous or psychiatric disorders, e.g., severe depression no  15. Fainting or syncope no  16. Dizziness, headaches, numbness, tingling or memory loss no  17. Unexplained weight loss no  18. Stroke, TIA or paralysis no  19. Missing or impaired hand, arm, foot, leg, finger, toe no  20. Spinal injury or disease no  21. Bone, muscles or nerve problems no  22. Blood clots or bleeding bleeding disorders no  23. Cancer no  24. Chronic infection or other chronic diseases no  25. Sleep disorders, pauses in breathing while asleep, daytime sleepiness, loud snoring yes  26. Have you ever had a sleep test? yes  27.  Have you ever spent a night in the hospital? - from air repair yes  28. Have you ever had a broken bone? no  29. Have you or or do you use tobacco products? no  30.  Regular, frequent alcohol use no  31. Illegal substance use within the past 2 years no  32.  Have you ever failed a drug test or been dependent on an illegal substance?  Current Medications: Prior to Admission medications   Medication Sig Start Date End Date Taking? Authorizing Provider  aspirin 81 MG tablet Take 81 mg by mouth daily.   Yes [provider]  lisinopril (PRINIVIL,ZESTRIL) 5 MG tablet TAKE 1 TABLET BY MOUTH DAILY. 06/04/17  Yes Chilton Si, MD  rosuvastatin (CRESTOR) 10 MG tablet Take 10 mg by mouth 2 (two) times a week.   Yes [provider]  benzonatate (TESSALON) 100 MG capsule Take 1-2 capsules (100-200 mg total) by mouth 3 (three) times daily as needed for cough. Patient not taking: Reported on 10/12/2017 02/23/17   Benjiman Core D, PA-C  fluticasone [facility]) 50 MCG/ACT nasal spray Place 2 sprays into both nostrils daily. Patient not taking: Reported on 10/12/2017 02/23/17   Benjiman Core D, PA-C  HYDROcodone-homatropine [facility]) 5-1.5 MG/5ML syrup Take 5 mLs by mouth every 8 (eight) hours as needed for cough. Patient not taking: Reported on 10/12/2017 02/28/17   Benjiman Core D, PA-C  magic mouthwash w/lidocaine SOLN Take 10 mLs by mouth every 2 (two) hours as needed for mouth pain. Patient not taking: Reported on 10/12/2017 02/23/17   Benjiman Core D, PA-C  naproxen sodium (ANAPROX DS) 550 MG tablet Take 1 tablet (550 mg total) by mouth 2 (two) times  daily with a meal. Patient not taking: Reported on 02/23/2017 10/06/16   Wallis BambergMani, Mario, PA-C  nitroGLYCERIN (NITROSTAT) 0.4 MG SL tablet Place 1 tablet (0.4 mg total) under the tongue every 5 (five) minutes as needed for chest pain. 03/03/16 06/01/16  Chilton Siandolph, Tiffany, MD  rosuvastatin (CRESTOR) 20 MG tablet TAKE 1 TABLET BY MOUTH DAILY. Patient not taking: Reported on 10/12/2017 08/13/17   Chilton Siandolph, Tiffany, MD     TESTING:   Visual Acuity Screening   Right eye Left eye Both eyes  Without  correction: 20/20-2 20/15 20/13  With correction:     Comments: Pt passed color test. Pt has 85 degrees for horizontal field of vision   Hearing Screening Comments: Passed whisper test   Monocular Vision: Yes.    Hearing Aid used for test: No. Hearing Aid required to to meet standard: No.  BP 130/88   Pulse 93   Temp 98.8 F (37.1 C) (Oral)   Resp 18   Ht 6' 0.44" (1.84 m)   Wt 271 lb 6.4 oz (123.1 kg)   SpO2 97%   BMI 36.36 kg/m  Pulse rate is regular  Comments: UA   SG 1.015    BL neg   GL neg   PR neg   PHYSICAL EXAMINATION:  1. Normal  General Appearance: Marked overweight, tremor, signs of alcoholism, problem drinking or drug abuse. 2. Normal Skin Exam - tattoos, scars 3. Normal Eyes: pupillary equality, reaction to light, accommodation, ocular motility, ocular muscle imbalance, extra ocular movement, nystagmus, exopthalmos. Ask about retinopathy, cataracts, aphakia, glaucoma, macular degeneration and refer to a specialist if appropriate.  4. Normal Ears: Scarring of tympanic membrane, occlusion of external canal, perforated eardrums.     5. Normal Mouth and Throat: Irremedial deformities likely to interfere with breathing or swallowing.    6. Normal Heart: Murmurs, extra sounds, enlarged heart, pacemaker, implantable defibrillator.     7. Normal Lungs and Chest, not including breast examination: Abnormal Chest wall expansion, abnormal respiratory rate, abnormal breath sounds including wheezes or alveolar rates, impaired respiratory function, cyanosis. Abnormal findings on physical exam may require further testing such as pulmonary tests and/or x ray of chest.  8. Normal Abdomen and Viscera: Enlarged liver, enlarged spleen, masses, bruits, hernia, significant abdominal wall muscle weakness.  9. Normal Genitourinary System: Hernia  10. Normal Spine, other musculoskeletal: Previous surgery, deformities, limitation of motion, tenderness. 11. Normal Extremities-Limb impaired:  Loss or impairment of leg, foot, toe, arm, hand, finger. Perceptible limp, deformities, atrophy, weakness, paralysis, clubbing, edema, hypotonia. Insufficient grasp and prehension to maintain steering wheel grip. Insufficient mobility and strength in lower limb to operate pedals properly. 12. Normal Neurological: Impaired equilibrium, coordination or speech pattern; paresthesia, asymmetric deep tendon reflexes, sensory or positional abnormalities, abnormal patellar and Babinski's reflexes 13. Normal Gait - antalgic, ataxia  14. Normal Vascular System: Abnormal pulse and amplitude, carotid or arterial bruits, varicose veins.   Does not meet standards. Certification Status: does not meet standards for 2 year certificate. Meets standards, but periodic monitoring required due to: HTN and cardiac disease  Driver qualified only for: 1 year   Wearing corrective lenses: no Wearing hearing aid: no Accompanied by a no waiver/exemption  Certification expires 10/13/2018  Benny LennertSarah Weber PA-C  [facility] 10/12/2017 11:40 AM

## 2017-10-12 NOTE — Patient Instructions (Signed)
     IF you received an x-ray today, you will receive an invoice from [facility]. Please contact  Radiology at [phone] with questions or concerns regarding your invoice.   IF you received labwork today, you will receive an invoice from [facility]. Please contact [facility] at [phone] with questions or concerns regarding your invoice.   Our billing staff will not be able to assist you with questions regarding bills from these companies.  You will be contacted with the lab results as soon as they are available. The fastest way to get your results is to activate your My Chart account. Instructions are located on the last page of this paperwork. If you have not heard from us regarding the results in 2 weeks, please contact this office.     

## 2018-05-03 ENCOUNTER — Other Ambulatory Visit: Payer: Self-pay

## 2018-05-03 ENCOUNTER — Ambulatory Visit (INDEPENDENT_AMBULATORY_CARE_PROVIDER_SITE_OTHER): Payer: BC Managed Care – PPO | Admitting: Emergency Medicine

## 2018-05-03 ENCOUNTER — Encounter: Payer: Self-pay | Admitting: Emergency Medicine

## 2018-05-03 VITALS — BP 120/80 | HR 59 | Temp 98.9°F | Resp 16 | Ht 72.0 in | Wt 267.6 lb

## 2018-05-03 DIAGNOSIS — E782 Mixed hyperlipidemia: Secondary | ICD-10-CM

## 2018-05-03 DIAGNOSIS — Z0000 Encounter for general adult medical examination without abnormal findings: Secondary | ICD-10-CM | POA: Diagnosis not present

## 2018-05-03 DIAGNOSIS — K5909 Other constipation: Secondary | ICD-10-CM | POA: Insufficient documentation

## 2018-05-03 DIAGNOSIS — R41840 Attention and concentration deficit: Secondary | ICD-10-CM | POA: Insufficient documentation

## 2018-05-03 NOTE — Patient Instructions (Addendum)

## 2018-05-03 NOTE — Progress Notes (Signed)
Victor Delgado 42 y.o.   Chief Complaint  Patient presents with  . Annual Exam  . Bloated    per patient constipation for a long time    HISTORY OF PRESENT ILLNESS: This is a 42 y.o. male here for annual exam.  Non-smoker and non-EtOH abuse. Has a history of chronic constipation. Also concerned about possibility of ADD.  Refers concentration, focus, and lack of attention symptoms. Has a history of early coronary artery disease and reactive hypertension.  On no medications at present time. Has a history of elevated cholesterol.  No medication at this time. Imaging, testing results, lab results, reviewed with patient.   HPI   Prior to Admission medications   Medication Sig Start Date End Date Taking? Authorizing Provider  aspirin 81 MG tablet Take 81 mg by mouth daily.    [provider]  lisinopril (PRINIVIL,ZESTRIL) 5 MG tablet TAKE 1 TABLET BY MOUTH DAILY. Patient not taking: Reported on 05/03/2018 06/04/17   Chilton Si, MD  nitroGLYCERIN (NITROSTAT) 0.4 MG SL tablet Place 1 tablet (0.4 mg total) under the tongue every 5 (five) minutes as needed for chest pain. 03/03/16 06/01/16  Chilton Si, MD  rosuvastatin (CRESTOR) 10 MG tablet Take 10 mg by mouth 2 (two) times a week.    [provider]    Allergies  Allergen Reactions  . Crestor [Rosuvastatin Calcium]     Headaches     Patient Active Problem List   Diagnosis Date Noted  . CAD (coronary artery disease) 07/26/2016  . Essential hypertension 04/26/2016  . Hyperlipidemia 04/26/2016  . Obesity (BMI 30-39.9) 04/26/2016  . Chest pain 03/09/2016    Past Medical History:  Diagnosis Date  . CAD (coronary artery disease) 07/26/2016  . Chest pain 03/09/2016  . Essential hypertension 04/26/2016  . Hyperlipidemia 04/26/2016  . Obesity (BMI 30-39.9) 04/26/2016    No past surgical history on file.  Social History   Socioeconomic History  . Marital status: Married    Spouse name: Not on file    . Number of children: Not on file  . Years of education: Not on file  . Highest education level: Not on file  Occupational History  . Not on file  Social Needs  . Financial resource strain: Not on file  . Food insecurity:    Worry: Not on file    Inability: Not on file  . Transportation needs:    Medical: Not on file    Non-medical: Not on file  Tobacco Use  . Smoking status: Never Smoker  . Smokeless tobacco: Never Used  Substance and Sexual Activity  . Alcohol use: No    Alcohol/week: 0.0 standard drinks  . Drug use: No  . Sexual activity: Yes  Lifestyle  . Physical activity:    Days per week: Not on file    Minutes per session: Not on file  . Stress: Not on file  Relationships  . Social connections:    Talks on phone: Not on file    Gets together: Not on file    Attends religious service: Not on file    Active member of club or organization: Not on file    Attends meetings of clubs or organizations: Not on file    Relationship status: Not on file  . Intimate partner violence:    Fear of current or ex partner: Not on file    Emotionally abused: Not on file    Physically abused: Not on file  Forced sexual activity: Not on file  Other Topics Concern  . Not on file  Social History Narrative  . Not on file    Family History  Problem Relation Age of Onset  . Hypertension Mother   . Diabetes Mother   . Heart attack Paternal Uncle   . Stroke Maternal Grandmother   . Stroke Maternal Grandfather   . Heart attack Paternal Grandfather      Review of Systems  Constitutional: Negative.  Negative for chills and fever.  HENT: Negative.  Negative for hearing loss and sore throat.   Eyes: Negative.  Negative for blurred vision and double vision.  Respiratory: Negative.  Negative for cough and shortness of breath.   Cardiovascular: Negative.  Negative for chest pain and palpitations.  Gastrointestinal: Positive for constipation. Negative for abdominal pain, diarrhea,  nausea and vomiting.  Genitourinary: Negative.   Musculoskeletal: Negative for back pain, myalgias and neck pain.  Skin: Negative.  Negative for rash.  Neurological: Negative.  Negative for dizziness and headaches.  Endo/Heme/Allergies: Negative.   All other systems reviewed and are negative.   Vitals:   05/03/18 0943  BP: 120/80  Pulse: (!) 59  Resp: 16  Temp: 98.9 F (37.2 C)  SpO2: 98%    Physical Exam  Constitutional: He is oriented to person, place, and time. He appears well-developed and well-nourished.  HENT:  Head: Normocephalic and atraumatic.  Right Ear: External ear normal.  Left Ear: External ear normal.  Nose: Nose normal.  Mouth/Throat: Oropharynx is clear and moist.  Eyes: Pupils are equal, round, and reactive to light. Conjunctivae and EOM are normal.  Neck: Normal range of motion. Neck supple.  Cardiovascular: Normal rate, regular rhythm and normal heart sounds.  Pulmonary/Chest: Effort normal and breath sounds normal.  Abdominal: Soft. Bowel sounds are normal. He exhibits no distension and no mass. There is no tenderness.  Musculoskeletal: Normal range of motion. He exhibits no edema.  Neurological: He is alert and oriented to person, place, and time. No sensory deficit. He exhibits normal muscle tone.  Skin: Skin is warm and dry. Capillary refill takes less than 2 seconds.  Psychiatric: He has a normal mood and affect. His behavior is normal.  Vitals reviewed.    ASSESSMENT & PLAN: Keziah was seen today for annual exam and bloated.  Diagnoses and all orders for this visit:  Routine general medical examination at a health care facility -     HIV Antibody (routine testing w rflx) -     Comprehensive metabolic panel -     Lipid panel  Chronic constipation -     Ambulatory referral to Gastroenterology  Mixed hyperlipidemia -     Comprehensive metabolic panel -     Lipid panel  Attention or concentration deficit -     Ambulatory referral to  Psychiatry    Patient Instructions       If you have lab work done today you will be contacted with your lab results within the next 2 weeks.  If you have not heard from [place] then please contact us. The fastest way to get your results is to register for My Chart.   IF you received an x-ray today, you will receive an invoice from [facility]. Please contact [facility] at [phone] with questions or concerns regarding your invoice.   IF you received labwork today, you will receive an invoice from [facility]. Please contact [facility] at [phone] with questions or concerns regarding your invoice.  Our billing staff will not be able to assist you with questions regarding bills from these companies.  You will be contacted with the lab results as soon as they are available. The fastest way to get your results is to activate your My Chart account. Instructions are located on the last page of this paperwork. If you have not heard from [place] regarding the results in 2 weeks, please contact this office.      Health Maintenance, Male A healthy lifestyle and preventive care is important for your health and wellness. Ask your health care provider about what schedule of regular examinations is right for you. What should I know about weight and diet? Eat a Healthy Diet  Eat plenty of vegetables, fruits, whole grains, low-fat dairy products, and lean protein.  Do not eat a lot of foods high in solid fats, added sugars, or salt.  Maintain a Healthy Weight Regular exercise can help you achieve or maintain a healthy weight. You should:  Do at least 150 minutes of exercise each week. The exercise should increase your heart rate and make you sweat (moderate-intensity exercise).  Do strength-training exercises at least twice a week.  Watch Your Levels of Cholesterol and Blood Lipids  Have your blood tested for lipids and cholesterol every 5 years starting at 40 years of age. If  you are at high risk for heart disease, you should start having your blood tested when you are 40 years old. You may need to have your cholesterol levels checked more often if: ? Your lipid or cholesterol levels are high. ? You are older than 40 years of age. ? You are at high risk for heart disease.  What should I know about cancer screening? Many types of cancers can be detected early and may often be prevented. Lung Cancer  You should be screened every year for lung cancer if: ? You are a current smoker who has smoked for at least 30 years. ? You are a former smoker who has quit within the past 15 years.  Talk to your health care provider about your screening options, when you should start screening, and how often you should be screened.  Colorectal Cancer  Routine colorectal cancer screening usually begins at 40 years of age and should be repeated every 5-10 years until you are 40 years old. You may need to be screened more often if early forms of precancerous polyps or small growths are found. Your health care provider may recommend screening at an earlier age if you have risk factors for colon cancer.  Your health care provider may recommend using home test kits to check for hidden blood in the stool.  A small camera at the end of a tube can be used to examine your colon (sigmoidoscopy or colonoscopy). This checks for the earliest forms of colorectal cancer.  Prostate and Testicular Cancer  Depending on your age and overall health, your health care provider may do certain tests to screen for prostate and testicular cancer.  Talk to your health care provider about any symptoms or concerns you have about testicular or prostate cancer.  Skin Cancer  Check your skin from head to toe regularly.  Tell your health care provider about any new moles or changes in moles, especially if: ? There is a change in a mole's size, shape, or color. ? You have a mole that is larger than a pencil  eraser.  Always use sunscreen. Apply sunscreen liberally and repeat throughout the day.  Protect yourself by wearing long sleeves, pants, a wide-brimmed hat, and sunglasses when outside.  What should I know about heart disease, diabetes, and high blood pressure?  If you are 81-27 years of age, have your blood pressure checked every 3-5 years. If you are 3 years of age or older, have your blood pressure checked every year. You should have your blood pressure measured twice-once when you are at a hospital or clinic, and once when you are not at a hospital or clinic. Record the average of the two measurements. To check your blood pressure when you are not at a hospital or clinic, you can use: ? An automated blood pressure machine at a pharmacy. ? A home blood pressure monitor.  Talk to your health care provider about your target blood pressure.  If you are between 19-38 years old, ask your health care provider if you should take aspirin to prevent heart disease.  Have regular diabetes screenings by checking your fasting blood sugar level. ? If you are at a normal weight and have a low risk for diabetes, have this test once every three years after the age of 21. ? If you are overweight and have a high risk for diabetes, consider being tested at a younger age or more often.  A one-time screening for abdominal aortic aneurysm (AAA) by ultrasound is recommended for men aged 65-75 years who are current or former smokers. What should I know about preventing infection? Hepatitis B If you have a higher risk for hepatitis B, you should be screened for this virus. Talk with your health care provider to find out if you are at risk for hepatitis B infection. Hepatitis C Blood testing is recommended for:  Everyone born from [birth year] through 1963.  Anyone with known risk factors for hepatitis C.  Sexually Transmitted Diseases (STDs)  You should be screened each year for STDs including gonorrhea and  chlamydia if: ? You are sexually active and are younger than 40 years of age. ? You are older than 40 years of age and your health care provider tells you that you are at risk for this type of infection. ? Your sexual activity has changed since you were last screened and you are at an increased risk for chlamydia or gonorrhea. Ask your health care provider if you are at risk.  Talk with your health care provider about whether you are at high risk of being infected with HIV. Your health care provider may recommend a prescription medicine to help prevent HIV infection.  What else can I do?  Schedule regular health, dental, and eye exams.  Stay current with your vaccines (immunizations).  Do not use any tobacco products, such as cigarettes, chewing tobacco, and e-cigarettes. If you need help quitting, ask your health care provider.  Limit alcohol intake to no more than 2 drinks per day. One drink equals 12 ounces of beer, 5 ounces of wine, or 1 ounces of hard liquor.  Do not use street drugs.  Do not share needles.  Ask your health care provider for help if you need support or information about quitting drugs.  Tell your health care provider if you often feel depressed.  Tell your health care provider if you have ever been abused or do not feel safe at home. This information is not intended to replace advice given to you by your health care provider. Make sure you discuss any questions you have with your health care provider. Document Released: 01/20/2008 Document Revised:  03/22/2016 Document Reviewed: 04/27/2015 Elsevier Interactive Patient Education  2018 [employer].      Edwina Barth, MD [facility]

## 2018-05-04 LAB — COMPREHENSIVE METABOLIC PANEL
A/G RATIO: 1.7 (ref 1.2–2.2)
ALBUMIN: 4.5 g/dL (ref 3.5–5.5)
ALK PHOS: 72 IU/L (ref 39–117)
ALT: 27 IU/L (ref 0–44)
AST: 17 IU/L (ref 0–40)
BUN / CREAT RATIO: 14 (ref 9–20)
BUN: 13 mg/dL (ref 6–24)
Bilirubin Total: 0.3 mg/dL (ref 0.0–1.2)
CO2: 23 mmol/L (ref 20–29)
Calcium: 9.6 mg/dL (ref 8.7–10.2)
Chloride: 102 mmol/L (ref 96–106)
Creatinine, Ser: 0.9 mg/dL (ref 0.76–1.27)
GFR calc Af Amer: 123 mL/min/{1.73_m2} (ref >59)
GFR, EST NON AFRICAN AMERICAN: 106 mL/min/{1.73_m2} (ref >59)
GLOBULIN, TOTAL: 2.7 g/dL (ref 1.5–4.5)
Glucose: 93 mg/dL (ref 65–99)
POTASSIUM: 4.2 mmol/L (ref 3.5–5.2)
Sodium: 142 mmol/L (ref 134–144)
Total Protein: 7.2 g/dL (ref 6.0–8.5)

## 2018-05-04 LAB — LIPID PANEL
Chol/HDL Ratio: 4.5 ratio (ref 0.0–5.0)
Cholesterol, Total: 213 mg/dL — ABNORMAL HIGH (ref 100–199)
HDL: 47 mg/dL (ref >39)
LDL Calculated: 134 mg/dL — ABNORMAL HIGH (ref 0–99)
TRIGLYCERIDES: 161 mg/dL — AB (ref 0–149)
VLDL Cholesterol Cal: 32 mg/dL (ref 5–40)

## 2018-05-04 LAB — HIV ANTIBODY (ROUTINE TESTING W REFLEX): HIV Screen 4th Generation wRfx: NONREACTIVE

## 2018-05-06 ENCOUNTER — Encounter: Payer: Self-pay | Admitting: *Deleted

## 2018-05-08 ENCOUNTER — Encounter: Payer: Self-pay | Admitting: Internal Medicine

## 2018-05-08 IMAGING — NM NM MISC PROCEDURE
6 series · 36 of 36 positions shown · non-contrast
Comparison: none

[Series 1: wbr_r-proj_st wbr rest · 6.40mm/px · 6 of 64 frames shown]
[frame 6/64]
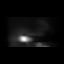
[frame 16/64]
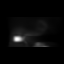
[frame 27/64]
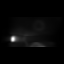
[frame 38/64]
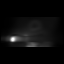
[frame 48/64]
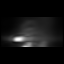
[frame 59/64]
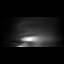

[Series 1: wbr rest · 6.40mm/px · 6 of 64 frames shown]
[frame 6/64]
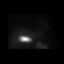
[frame 16/64]
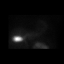
[frame 27/64]
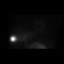
[frame 38/64]
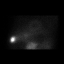
[frame 48/64]
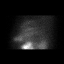
[frame 59/64]
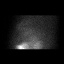

[Series 2: wbr_s-proj_st wbr stress-gsp · 6.40mm/px · 6 of 512 frames shown]
[frame 43/512]
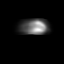
[frame 128/512]
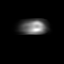
[frame 214/512]
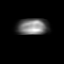
[frame 299/512]
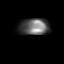
[frame 384/512]
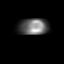
[frame 470/512]
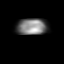

[Series 2: wbr stress-gsp · 6.40mm/px · 6 of 512 frames shown]
[frame 43/512]
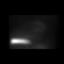
[frame 128/512]
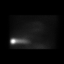
[frame 214/512]
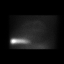
[frame 299/512]
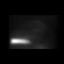
[frame 384/512]
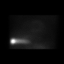
[frame 470/512]
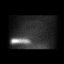

[Series 3: wbr stress-sum-em · 6.40mm/px · 6 of 64 frames shown]
[frame 6/64]
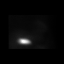
[frame 16/64]
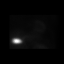
[frame 27/64]
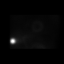
[frame 38/64]
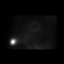
[frame 48/64]
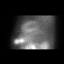
[frame 59/64]
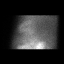

[Series 3: wbr_s-proj_st wbr stress-sum-em · 6.40mm/px · 6 of 64 frames shown]
[frame 6/64]
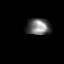
[frame 16/64]
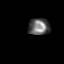
[frame 27/64]
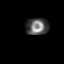
[frame 38/64]
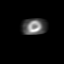
[frame 48/64]
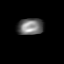
[frame 59/64]
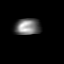

[36 of 36 positions shown; findings below may reference images not displayed]

Canned report from images found in remote index.

Refer to host system for actual result text.

## 2018-05-13 ENCOUNTER — Encounter: Payer: Self-pay | Admitting: Emergency Medicine

## 2018-05-14 ENCOUNTER — Other Ambulatory Visit: Payer: Self-pay | Admitting: *Deleted

## 2018-05-16 ENCOUNTER — Other Ambulatory Visit: Payer: Self-pay

## 2018-05-16 MED ORDER — ROSUVASTATIN CALCIUM 10 MG PO TABS: 10.0000 mg | ORAL_TABLET | Freq: Every day | ORAL | 1 refills | Status: DC

## 2018-06-10 ENCOUNTER — Ambulatory Visit: Payer: BC Managed Care – PPO | Admitting: Internal Medicine

## 2018-06-10 ENCOUNTER — Encounter: Payer: Self-pay | Admitting: Internal Medicine

## 2018-06-10 VITALS — BP 130/88 | HR 80 | Ht 73.0 in | Wt 275.0 lb

## 2018-06-10 DIAGNOSIS — R194 Change in bowel habit: Secondary | ICD-10-CM

## 2018-06-10 DIAGNOSIS — R1084 Generalized abdominal pain: Secondary | ICD-10-CM | POA: Diagnosis not present

## 2018-06-10 DIAGNOSIS — K5900 Constipation, unspecified: Secondary | ICD-10-CM

## 2018-06-10 DIAGNOSIS — R140 Abdominal distension (gaseous): Secondary | ICD-10-CM | POA: Diagnosis not present

## 2018-06-10 MED ORDER — NA SULFATE-K SULFATE-MG SULF 17.5-3.13-1.6 GM/177ML PO SOLN: 1.0000 | Freq: Once | ORAL | 0 refills | Status: AC

## 2018-06-10 NOTE — Patient Instructions (Signed)

## 2018-06-10 NOTE — Progress Notes (Signed)
HISTORY OF PRESENT ILLNESS:  Victor Delgado is a 42 y.o. male, carpentry maintenance at [employer], with hypertension, hyperlipidemia, coronary artery disease, and obesity.  He is sent today by his primary care provider Dr. Alvy Bimler with chief complaint of progressive constipation, abdominal discomfort, and change in bowel habits.  Patient reports long-standing problems with constipation and associated abdominal bloating.  These problems have worsened over the past year.  Over the past few months he has noticed mucus in his stool as well as thinning of his stool caliber.  He is color blind and is not sure whether he has had rectal bleeding.  No family history of colon cancer.  His weight is stable.  He has not had recent GI evaluation.  Review of outside laboratories from May 03, 2018 finds unremarkable comprehensive metabolic panel.  CBC from July 2018 was normal with hemoglobin 15.9.  No relevant x-ray abnormalities noted.  REVIEW OF SYSTEMS:  All non-GI ROS negative unless otherwise stated in the HPI.  Past Medical History:  Diagnosis Date  . CAD (coronary artery disease) 07/26/2016  . Chest pain 03/09/2016  . Essential hypertension 04/26/2016  . Hyperlipidemia 04/26/2016  . Obesity (BMI 30-39.9) 04/26/2016    Past Surgical History:  Procedure Laterality Date  . arm surgery Left     Social History Victor Delgado  reports that he has never smoked. He has never used smokeless tobacco. He reports that he does not drink alcohol or use drugs.  family history includes Diabetes in his mother; Heart attack in his paternal grandfather and paternal uncle; Hypertension in his mother; Stroke in his maternal grandfather and maternal grandmother.  No Active Allergies     PHYSICAL EXAMINATION: Vital signs: BP 130/88   Pulse 80   Ht 6\' 1"  (1.854 m)   Wt 275 lb (124.7 kg)   BMI 36.28 kg/m   Constitutional: generally well-appearing, no acute distress Psychiatric: alert and oriented x3,  cooperative Eyes: extraocular movements intact, anicteric, conjunctiva pink Mouth: oral pharynx moist, no lesions Neck: supple no lymphadenopathy Cardiovascular: heart regular rate and rhythm, no murmur Lungs: clear to auscultation bilaterally Abdomen: soft, nontender, nondistended, no obvious ascites, no peritoneal signs, normal bowel sounds, no organomegaly Rectal: Deferred until colonoscopy Extremities: no clubbing, cyanosis, or lower extremity edema bilaterally Skin: no lesions on visible extremities except for large scar on left forearm Neuro: No focal deficits.  Cranial nerves intact  ASSESSMENT:  1.  Chronic constipation.  Worsening 2.  Abdominal discomfort and bloating associated with constipation 3.  Change in stool caliber and passage of mucus per rectum.  Rule out colon cancer   PLAN:  1.  Colonoscopy. 2.  If no obstructing lesion, then consider Linzess therapy.The nature of the procedure, as well as the risks, benefits, and alternatives were carefully and thoroughly reviewed with the patient. Ample time for discussion and questions allowed. The patient understood, was satisfied, and agreed to proceed. 3.  Ongoing general medical care with PCP  A copy of this consultation note has been sent to Dr. Alvy Bimler

## 2018-06-21 ENCOUNTER — Encounter: Payer: Self-pay | Admitting: Internal Medicine

## 2018-06-27 ENCOUNTER — Encounter: Payer: Self-pay | Admitting: Emergency Medicine

## 2018-06-27 NOTE — Telephone Encounter (Signed)
Stop Crestor and try diet and exercise. Repeat labs in 3-6 months.

## 2018-07-03 ENCOUNTER — Ambulatory Visit (AMBULATORY_SURGERY_CENTER): Payer: BC Managed Care – PPO | Admitting: Internal Medicine

## 2018-07-03 ENCOUNTER — Encounter: Payer: Self-pay | Admitting: Internal Medicine

## 2018-07-03 VITALS — BP 116/73 | HR 59 | Temp 99.1°F | Resp 18 | Ht 73.0 in | Wt 275.0 lb

## 2018-07-03 DIAGNOSIS — R140 Abdominal distension (gaseous): Secondary | ICD-10-CM

## 2018-07-03 DIAGNOSIS — R194 Change in bowel habit: Secondary | ICD-10-CM | POA: Diagnosis not present

## 2018-07-03 DIAGNOSIS — K5900 Constipation, unspecified: Secondary | ICD-10-CM

## 2018-07-03 MED ORDER — SODIUM CHLORIDE 0.9 % IV SOLN: 500.0000 mL | Freq: Once | INTRAVENOUS | Status: DC

## 2018-07-03 NOTE — Progress Notes (Signed)
Called to room to assist during endoscopic procedure.  Patient ID and intended procedure confirmed with present staff. Received instructions for my participation in the procedure from the performing physician.  

## 2018-07-03 NOTE — Patient Instructions (Signed)
Linzess samples given.   YOU HAD AN ENDOSCOPIC PROCEDURE TODAY AT THE [facility]:   Refer to the procedure report that was given to you for any specific questions about what was found during the examination.  If the procedure report does not answer your questions, please call your gastroenterologist to clarify.  If you requested that your care partner not be given the details of your procedure findings, then the procedure report has been included in a sealed envelope for you to review at your convenience later.  YOU SHOULD EXPECT: Some feelings of bloating in the abdomen. Passage of more gas than usual.  Walking can help get rid of the air that was put into your GI tract during the procedure and reduce the bloating. If you had a lower endoscopy (such as a colonoscopy or flexible sigmoidoscopy) you may notice spotting of blood in your stool or on the toilet paper. If you underwent a bowel prep for your procedure, you may not have a normal bowel movement for a few days.  Please Note:  You might notice some irritation and congestion in your nose or some drainage.  This is from the oxygen used during your procedure.  There is no need for concern and it should clear up in a day or so.  SYMPTOMS TO REPORT IMMEDIATELY:   Following lower endoscopy (colonoscopy or flexible sigmoidoscopy):  Excessive amounts of blood in the stool  Significant tenderness or worsening of abdominal pains  Swelling of the abdomen that is new, acute  Fever of 100F or higher    For urgent or emergent issues, a gastroenterologist can be reached at any hour by calling [phone].   DIET:  We do recommend a small meal at first, but then you may proceed to your regular diet.  Drink plenty of fluids but you should avoid alcoholic beverages for 24 hours.  ACTIVITY:  You should plan to take it easy for the rest of today and you should NOT DRIVE or use heavy machinery until tomorrow (because of the sedation  medicines used during the test).    FOLLOW UP: Our staff will call the number listed on your records the next business day following your procedure to check on you and address any questions or concerns that you may have regarding the information given to you following your procedure. If we do not reach you, we will leave a message.  However, if you are feeling well and you are not experiencing any problems, there is no need to return our call.  We will assume that you have returned to your regular daily activities without incident.  If any biopsies were taken you will be contacted by phone or by letter within the next 1-3 weeks.  Please call us at [phone] if you have not heard about the biopsies in 3 weeks.    SIGNATURES/CONFIDENTIALITY: You and/or your care partner have signed paperwork which will be entered into your electronic medical record.  These signatures attest to the fact that that the information above on your After Visit Summary has been reviewed and is understood.  Full responsibility of the confidentiality of this discharge information lies with you and/or your care-partner.

## 2018-07-03 NOTE — Op Note (Signed)
[facility] Patient Name: Victor Delgado Procedure Date: 07/03/2018 11:46 AM MRN: [id number] Endoscopist: Wilhemina Bonito. Marina Goodell , MD Age: 42 Referring MD:  Date of Birth: 07-Jun-1976 Gender: Male Account #: [id number] Procedure:                Colonoscopy with biopsies Indications:              Abdominal pain, Change in bowel habits, Change in                            stool caliber, Constipation Medicines:                Monitored Anesthesia Care Procedure:                Pre-Anesthesia Assessment:                           - Prior to the procedure, a History and Physical                            was performed, and patient medications and                            allergies were reviewed. The patient's tolerance of                            previous anesthesia was also reviewed. The risks                            and benefits of the procedure and the sedation                            options and risks were discussed with the patient.                            All questions were answered, and informed consent                            was obtained. Prior Anticoagulants: The patient has                            taken no previous anticoagulant or antiplatelet                            agents. ASA Grade Assessment: II - A patient with                            mild systemic disease. After reviewing the risks                            and benefits, the patient was deemed in                            satisfactory condition to undergo the procedure.  After obtaining informed consent, the colonoscope                            was passed under direct vision. Throughout the                            procedure, the patient's blood pressure, pulse, and                            oxygen saturations were monitored continuously. The                            Colonoscope was introduced through the anus and                            advanced to the the  cecum, identified by                            appendiceal orifice and ileocecal valve. The                            terminal ileum, ileocecal valve, appendiceal                            orifice, and rectum were photographed. The quality                            of the bowel preparation was excellent. The                            colonoscopy was performed without difficulty. The                            patient tolerated the procedure well. The bowel                            preparation used was SUPREP. Scope In: 11:54:37 AM Scope Out: 12:10:56 PM Scope Withdrawal Time: 0 hours 14 minutes 15 seconds  Total Procedure Duration: 0 hours 16 minutes 19 seconds  Findings:                 The terminal ileum appeared normal.                           A patchy area of mildly erythematous mucosa was                            found in the rectum. The findings were nonspecific.                            Biopsies were taken with a cold forceps for                            histology.  The exam was otherwise without abnormality on                            direct and retroflexion views. Complications:            No immediate complications. Estimated blood loss:                            None. Estimated Blood Loss:     Estimated blood loss: none. Impression:               - The examined portion of the ileum was normal.                           - Mild erythematous mucosa in the rectum. Biopsied.                           - The examination was otherwise normal on direct                            and retroflexion views. Recommendation:           - Repeat colonoscopy in 10 years for screening                            purposes.                           - Patient has a contact number available for                            emergencies. The signs and symptoms of potential                            delayed complications were discussed with the                             patient. Return to normal activities tomorrow.                            Written discharge instructions were provided to the                            patient.                           - Resume previous diet.                           - Continue present medications.                           - Await pathology results.                           - Linzess 270 mcg daily. We have given you samples.  This is for constipation and abdominal bloating                            discomfort. After completing the samples, please                            contact the office and let us know if this was                            helpful. If so, we are happy to prescribe this for                            regular use. If not, we can provide alternative                            suggestion and follow-up Wilhemina Bonito. Marina Goodell, MD 07/03/2018 12:19:35 PM This report has been signed electronically.

## 2018-07-03 NOTE — Progress Notes (Signed)
Report to PACU, RN, vss, BBS= Clear.  

## 2018-07-05 ENCOUNTER — Telehealth: Payer: Self-pay | Admitting: Emergency Medicine

## 2018-07-05 NOTE — Telephone Encounter (Signed)
Called and spoke with pt regarding their appt on 08/20/18 with Dr. Alvy BimlerSagardia. Due to provider schedule change, Dr. Alvy BimlerSagardia is unavailable that day. I was able to get pt rescheduled 08/21/18 at 3:00. I advised of time, building and late policy.

## 2018-07-08 ENCOUNTER — Telehealth: Payer: Self-pay

## 2018-07-08 NOTE — Telephone Encounter (Signed)
  Follow up Call-  Call back number 07/03/2018  Post procedure Call Back phone  # [phone]  Permission to leave phone message Yes  Some recent data might be hidden     Patient questions:  Do you have a fever, pain , or abdominal swelling? No. Pain Score  0 *  Have you tolerated food without any problems? Yes.    Have you been able to return to your normal activities? Yes.    Do you have any questions about your discharge instructions: Diet   No. Medications  No. Follow up visit  No.  Do you have questions or concerns about your Care? No.  Actions: * If pain score is 4 or above: No action needed, pain <4.

## 2018-07-10 ENCOUNTER — Encounter: Payer: Self-pay | Admitting: Internal Medicine

## 2018-07-19 ENCOUNTER — Telehealth: Payer: Self-pay | Admitting: Internal Medicine

## 2018-07-19 NOTE — Telephone Encounter (Signed)
Patient states Dr.Perry gave him samples of linzess 290 and it is working but he is still having bloating and now diarrhea. Patient wants to know if Dr.Perry could prescribe a lower dose or something else.

## 2018-07-22 MED ORDER — LINACLOTIDE 72 MCG PO CAPS: 72.0000 ug | ORAL_CAPSULE | Freq: Every day | ORAL | 3 refills | Status: DC

## 2018-07-22 NOTE — Telephone Encounter (Signed)
Pt states he has been taking the samples of linzess 290 but he is having diarrhea and bloating. Pt calling wanting to know if he can either try a lower dose of the linzess or a different drug, whatever Dr. Marina GoodellPerry recommends. Please advise.

## 2018-07-22 NOTE — Telephone Encounter (Signed)
He can try Linzess 72 mcg daily or 145 mcg daily.

## 2018-07-22 NOTE — Telephone Encounter (Signed)
Spoke with pt and he is aware, script sent to pharmacy. 

## 2018-08-20 ENCOUNTER — Ambulatory Visit: Payer: BC Managed Care – PPO | Admitting: Emergency Medicine

## 2018-08-21 ENCOUNTER — Ambulatory Visit: Payer: BC Managed Care – PPO | Admitting: Emergency Medicine

## 2018-08-21 ENCOUNTER — Encounter: Payer: Self-pay | Admitting: Emergency Medicine

## 2018-08-21 VITALS — BP 134/89 | HR 85 | Temp 98.5°F | Resp 17 | Ht 73.0 in | Wt 279.0 lb

## 2018-08-21 DIAGNOSIS — E782 Mixed hyperlipidemia: Secondary | ICD-10-CM

## 2018-08-21 NOTE — Progress Notes (Signed)
Victor Delgado 42 y.o.   Chief Complaint  Patient presents with  . Follow-up    high cholestrol    Lab Results  Component Value Date   CHOL 213 (H) 05/03/2018   HDL 47 05/03/2018   LDLCALC 134 (H) 05/03/2018   TRIG 161 (H) 05/03/2018   CHOLHDL 4.5 05/03/2018   BP Readings from Last 3 Encounters:  08/21/18 134/89  07/03/18 116/73  06/10/18 130/88   Wt Readings from Last 3 Encounters:  08/21/18 279 lb (126.6 kg)  07/03/18 275 lb (124.7 kg)  06/10/18 275 lb (124.7 kg)    HISTORY OF PRESENT ILLNESS: This is a 43 y.o. male recently found to have high cholesterol last September during the routine general exam.  Advised to start Crestor but he developed muscle achiness and stopped taking it.  Here for follow-up.  Not fasting.  No other complaints or medical concerns.  HPI   Prior to Admission medications   Medication Sig Start Date End Date Taking? Authorizing Provider  linaclotide (LINZESS) 72 MCG capsule Take 1 capsule (72 mcg total) by mouth daily before breakfast. 07/22/18  Yes Hilarie Fredrickson, MD  rosuvastatin (CRESTOR) 10 MG tablet Take 1 tablet (10 mg total) by mouth daily. Return in 3 months for follow up labs to evaluate med. Patient not taking: Reported on 08/21/2018 05/16/18   Georgina Quint, MD    No Active Allergies  Patient Active Problem List   Diagnosis Date Noted  . Chronic constipation 05/03/2018  . Attention or concentration deficit 05/03/2018  . Routine general medical examination at a health care facility 05/03/2018  . CAD (coronary artery disease) 07/26/2016  . Essential hypertension 04/26/2016  . Hyperlipidemia 04/26/2016  . Obesity (BMI 30-39.9) 04/26/2016  . Chest pain 03/09/2016    Past Medical History:  Diagnosis Date  . CAD (coronary artery disease) 07/26/2016  . Chest pain 03/09/2016  . Essential hypertension 04/26/2016  . Hyperlipidemia 04/26/2016  . Obesity (BMI 30-39.9) 04/26/2016    Past Surgical History:  Procedure Laterality  Date  . arm surgery Left     Social History   Socioeconomic History  . Marital status: Married    Spouse name: Not on file  . Number of children: Not on file  . Years of education: Not on file  . Highest education level: Not on file  Occupational History  . Not on file  Social Needs  . Financial resource strain: Not on file  . Food insecurity:    Worry: Not on file    Inability: Not on file  . Transportation needs:    Medical: Not on file    Non-medical: Not on file  Tobacco Use  . Smoking status: Never Smoker  . Smokeless tobacco: Never Used  Substance and Sexual Activity  . Alcohol use: No    Alcohol/week: 0.0 standard drinks  . Drug use: No  . Sexual activity: Yes  Lifestyle  . Physical activity:    Days per week: Not on file    Minutes per session: Not on file  . Stress: Not on file  Relationships  . Social connections:    Talks on phone: Not on file    Gets together: Not on file    Attends religious service: Not on file    Active member of club or organization: Not on file    Attends meetings of clubs or organizations: Not on file    Relationship status: Not on file  . Intimate partner violence:  Fear of current or ex partner: Not on file    Emotionally abused: Not on file    Physically abused: Not on file    Forced sexual activity: Not on file  Other Topics Concern  . Not on file  Social History Narrative  . Not on file    Family History  Problem Relation Age of Onset  . Hypertension Mother   . Diabetes Mother   . Heart attack Paternal Uncle   . Stroke Maternal Grandmother   . Stroke Maternal Grandfather   . Heart attack Paternal Grandfather   . Esophageal cancer Neg Hx   . Pancreatic cancer Neg Hx   . Stomach cancer Neg Hx   . Liver disease Neg Hx   . Colon cancer Neg Hx     The 10-year ASCVD risk score Denman George DC Jr., et al., 2013) is: 1.5%   Values used to calculate the score:     Age: 88 years     Sex: Male     Is Non-Hispanic African  American: No     Diabetic: No     Tobacco smoker: No     Systolic Blood Pressure: 134 mmHg     Is BP treated: No     HDL Cholesterol: 47 mg/dL     Total Cholesterol: 213 mg/dL  Review of Systems  Constitutional: Negative.  Negative for chills and fever.  HENT: Negative.   Respiratory: Negative.  Negative for shortness of breath.   Cardiovascular: Negative.  Negative for chest pain and palpitations.  Gastrointestinal: Negative.  Negative for abdominal pain, diarrhea, nausea and vomiting.  Genitourinary: Negative.  Negative for dysuria and urgency.  Skin: Negative.  Negative for rash.  Neurological: Negative.  Negative for dizziness and headaches.  Endo/Heme/Allergies: Negative.   All other systems reviewed and are negative.   Vitals:   08/21/18 1455  BP: 134/89  Pulse: 85  Resp: 17  Temp: 98.5 F (36.9 C)  SpO2: 98%    Physical Exam Vitals signs reviewed.  HENT:     Head: Normocephalic and atraumatic.     Nose: Nose normal.     Mouth/Throat:     Mouth: Mucous membranes are moist.     Pharynx: Oropharynx is clear.  Eyes:     Extraocular Movements: Extraocular movements intact.     Conjunctiva/sclera: Conjunctivae normal.     Pupils: Pupils are equal, round, and reactive to light.  Neck:     Musculoskeletal: Normal range of motion and neck supple.  Cardiovascular:     Rate and Rhythm: Normal rate and regular rhythm.     Heart sounds: Normal heart sounds.  Pulmonary:     Effort: Pulmonary effort is normal.     Breath sounds: Normal breath sounds.  Musculoskeletal: Normal range of motion.  Skin:    General: Skin is warm and dry.     Capillary Refill: Capillary refill takes less than 2 seconds.  Neurological:     General: No focal deficit present.     Mental Status: He is alert and oriented to person, place, and time.    A total of 25 minutes was spent in the room with the patient, greater than 50% of which was in counseling/coordination of care regarding high  cholesterol, treatment, medications, need for repeat blood work, and need for follow-up.   ASSESSMENT & PLAN: Victor Delgado was seen today for follow-up.  Diagnoses and all orders for this visit:  Mixed hyperlipidemia -     Lipid panel;  Future   Advised to start Crestor again but take it at bedtime.   Patient Instructions       If you have lab work done today you will be contacted with your lab results within the next 2 weeks.  If you have not heard from us then please contact us. The fastest way to get your results is to register for My Chart.   IF you received an x-ray today, you will receive an invoice from [facility]. Please contact [facility] at [phone] with questions or concerns regarding your invoice.   IF you received labwork today, you will receive an invoice from [facility]. Please contact [facility] at [phone] with questions or concerns regarding your invoice.   Our billing staff will not be able to assist you with questions regarding bills from these companies.  You will be contacted with the lab results as soon as they are available. The fastest way to get your results is to activate your My Chart account. Instructions are located on the last page of this paperwork. If you have not heard from us regarding the results in 2 weeks, please contact this office.      High Cholesterol  High cholesterol is a condition in which the blood has high levels of a white, waxy, fat-like substance (cholesterol). The human body needs small amounts of cholesterol. The liver makes all the cholesterol that the body needs. Extra (excess) cholesterol comes from the food that we eat. Cholesterol is carried from the liver by the blood through the blood vessels. If you have high cholesterol, deposits (plaques) may build up on the walls of your blood vessels (arteries). Plaques make the arteries narrower and stiffer. Cholesterol plaques increase your risk for heart attack  and stroke. Work with your health care provider to keep your cholesterol levels in a healthy range. What increases the risk? This condition is more likely to develop in people who:  Eat foods that are high in animal fat (saturated fat) or cholesterol.  Are overweight.  Are not getting enough exercise.  Have a family history of high cholesterol. What are the signs or symptoms? There are no symptoms of this condition. How is this diagnosed? This condition may be diagnosed from the results of a blood test.  If you are older than age 41, your health care provider may check your cholesterol every 4-6 years.  You may be checked more often if you already have high cholesterol or other risk factors for heart disease. The blood test for cholesterol measures:  "Bad" cholesterol (LDL cholesterol). This is the main type of cholesterol that causes heart disease. The desired level for LDL is less than 100.  "Good" cholesterol (HDL cholesterol). This type helps to protect against heart disease by cleaning the arteries and carrying the LDL away. The desired level for HDL is 60 or higher.  Triglycerides. These are fats that the body can store or burn for energy. The desired number for triglycerides is lower than 150.  Total cholesterol. This is a measure of the total amount of cholesterol in your blood, including LDL cholesterol, HDL cholesterol, and triglycerides. A healthy number is less than 200. How is this treated? This condition is treated with diet changes, lifestyle changes, and medicines. Diet changes  This may include eating more whole grains, fruits, vegetables, nuts, and fish.  This may also include cutting back on red meat and foods that have a lot of added sugar. Lifestyle changes  Changes may include getting  at least 40 minutes of aerobic exercise 3 times a week. Aerobic exercises include walking, biking, and swimming. Aerobic exercise along with a healthy diet can help you  maintain a healthy weight.  Changes may also include quitting smoking. Medicines  Medicines are usually given if diet and lifestyle changes have failed to reduce your cholesterol to healthy levels.  Your health care provider may prescribe a statin medicine. Statin medicines have been shown to reduce cholesterol, which can reduce the risk of heart disease. Follow these instructions at home: Eating and drinking If told by your health care provider:  Eat chicken (without skin), fish, veal, shellfish, ground [place] breast, and round or loin cuts of red meat.  Do not eat fried foods or fatty meats, such as hot dogs and salami.  Eat plenty of fruits, such as apples.  Eat plenty of vegetables, such as broccoli, potatoes, and carrots.  Eat beans, peas, and lentils.  Eat grains such as barley, rice, couscous, and bulgur wheat.  Eat pasta without cream sauces.  Use skim or nonfat milk, and eat low-fat or nonfat yogurt and cheeses.  Do not eat or drink whole milk, cream, ice cream, egg yolks, or hard cheeses.  Do not eat stick margarine or tub margarines that contain trans fats (also called partially hydrogenated oils).  Do not eat saturated tropical oils, such as coconut oil and palm oil.  Do not eat cakes, cookies, crackers, or other baked goods that contain trans fats.  General instructions  Exercise as directed by your health care provider. Increase your activity level with activities such as gardening, walking, and taking the stairs.  Take over-the-counter and prescription medicines only as told by your health care provider.  Do not use any products that contain nicotine or tobacco, such as cigarettes and e-cigarettes. If you need help quitting, ask your health care provider.  Keep all follow-up visits as told by your health care provider. This is important. Contact a health care provider if:  You are struggling to maintain a healthy diet or weight.  You need help to start  on an exercise program.  You need help to stop smoking. Get help right away if:  You have chest pain.  You have trouble breathing. This information is not intended to replace advice given to you by your health care provider. Make sure you discuss any questions you have with your health care provider. Document Released: 07/24/2005 Document Revised: 02/19/2016 Document Reviewed: 01/22/2016 Elsevier Interactive Patient Education  2019 [employer] Inc.      Edwina BarthMiguel Donnamarie Shankles, MD [facility]

## 2018-08-21 NOTE — Patient Instructions (Addendum)
If you have lab work done today you will be contacted with your lab results within the next 2 weeks.  If you have not heard from [place] then please contact us. The fastest way to get your results is to register for My Chart.   IF you received an x-ray today, you will receive an invoice from [facility]. Please contact [facility] at [phone] with questions or concerns regarding your invoice.   IF you received labwork today, you will receive an invoice from [place]. Please contact [facility] at [phone] with questions or concerns regarding your invoice.   Our billing staff will not be able to assist you with questions regarding bills from these companies.  You will be contacted with the lab results as soon as they are available. The fastest way to get your results is to activate your My Chart account. Instructions are located on the last page of this paperwork. If you have not heard from [place] regarding the results in 2 weeks, please contact this office.      High Cholesterol  High cholesterol is a condition in which the blood has high levels of a white, waxy, fat-like substance (cholesterol). The human body needs small amounts of cholesterol. The liver makes all the cholesterol that the body needs. Extra (excess) cholesterol comes from the food that we eat. Cholesterol is carried from the liver by the blood through the blood vessels. If you have high cholesterol, deposits (plaques) may build up on the walls of your blood vessels (arteries). Plaques make the arteries narrower and stiffer. Cholesterol plaques increase your risk for heart attack and stroke. Work with your health care provider to keep your cholesterol levels in a healthy range. What increases the risk? This condition is more likely to develop in people who:  Eat foods that are high in animal fat (saturated fat) or cholesterol.  Are overweight.  Are not getting enough exercise.  Have a family history  of high cholesterol. What are the signs or symptoms? There are no symptoms of this condition. How is this diagnosed? This condition may be diagnosed from the results of a blood test.  If you are older than age 55, your health care provider may check your cholesterol every 4-6 years.  You may be checked more often if you already have high cholesterol or other risk factors for heart disease. The blood test for cholesterol measures:  "Bad" cholesterol (LDL cholesterol). This is the main type of cholesterol that causes heart disease. The desired level for LDL is less than 100.  "Good" cholesterol (HDL cholesterol). This type helps to protect against heart disease by cleaning the arteries and carrying the LDL away. The desired level for HDL is 60 or higher.  Triglycerides. These are fats that the body can store or burn for energy. The desired number for triglycerides is lower than 150.  Total cholesterol. This is a measure of the total amount of cholesterol in your blood, including LDL cholesterol, HDL cholesterol, and triglycerides. A healthy number is less than 200. How is this treated? This condition is treated with diet changes, lifestyle changes, and medicines. Diet changes  This may include eating more whole grains, fruits, vegetables, nuts, and fish.  This may also include cutting back on red meat and foods that have a lot of added sugar. Lifestyle changes  Changes may include getting at least 40 minutes of aerobic exercise 3 times a week. Aerobic exercises include walking, biking, and swimming. Aerobic exercise  along with a healthy diet can help you maintain a healthy weight.  Changes may also include quitting smoking. Medicines  Medicines are usually given if diet and lifestyle changes have failed to reduce your cholesterol to healthy levels.  Your health care provider may prescribe a statin medicine. Statin medicines have been shown to reduce cholesterol, which can reduce the  risk of heart disease. Follow these instructions at home: Eating and drinking If told by your health care provider:  Eat chicken (without skin), fish, veal, shellfish, ground [place] breast, and round or loin cuts of red meat.  Do not eat fried foods or fatty meats, such as hot dogs and salami.  Eat plenty of fruits, such as apples.  Eat plenty of vegetables, such as broccoli, potatoes, and carrots.  Eat beans, peas, and lentils.  Eat grains such as barley, rice, couscous, and bulgur wheat.  Eat pasta without cream sauces.  Use skim or nonfat milk, and eat low-fat or nonfat yogurt and cheeses.  Do not eat or drink whole milk, cream, ice cream, egg yolks, or hard cheeses.  Do not eat stick margarine or tub margarines that contain trans fats (also called partially hydrogenated oils).  Do not eat saturated tropical oils, such as coconut oil and palm oil.  Do not eat cakes, cookies, crackers, or other baked goods that contain trans fats.  General instructions  Exercise as directed by your health care provider. Increase your activity level with activities such as gardening, walking, and taking the stairs.  Take over-the-counter and prescription medicines only as told by your health care provider.  Do not use any products that contain nicotine or tobacco, such as cigarettes and e-cigarettes. If you need help quitting, ask your health care provider.  Keep all follow-up visits as told by your health care provider. This is important. Contact a health care provider if:  You are struggling to maintain a healthy diet or weight.  You need help to start on an exercise program.  You need help to stop smoking. Get help right away if:  You have chest pain.  You have trouble breathing. This information is not intended to replace advice given to you by your health care provider. Make sure you discuss any questions you have with your health care provider. Document Released: 07/24/2005  Document Revised: 02/19/2016 Document Reviewed: 01/22/2016 Elsevier Interactive Patient Education  [employer].

## 2018-08-23 ENCOUNTER — Ambulatory Visit (INDEPENDENT_AMBULATORY_CARE_PROVIDER_SITE_OTHER): Payer: BC Managed Care – PPO | Admitting: Emergency Medicine

## 2018-08-23 DIAGNOSIS — E782 Mixed hyperlipidemia: Secondary | ICD-10-CM

## 2018-08-23 NOTE — Progress Notes (Signed)
Nurse visit for labs only

## 2018-08-24 LAB — LIPID PANEL
Chol/HDL Ratio: 5.4 ratio — ABNORMAL HIGH (ref 0.0–5.0)
Cholesterol, Total: 244 mg/dL — ABNORMAL HIGH (ref 100–199)
HDL: 45 mg/dL (ref >39)
LDL Calculated: 164 mg/dL — ABNORMAL HIGH (ref 0–99)
Triglycerides: 173 mg/dL — ABNORMAL HIGH (ref 0–149)
VLDL CHOLESTEROL CAL: 35 mg/dL (ref 5–40)

## 2018-08-26 ENCOUNTER — Encounter: Payer: Self-pay | Admitting: Emergency Medicine

## 2018-08-27 ENCOUNTER — Encounter: Payer: Self-pay | Admitting: Radiology

## 2019-12-11 ENCOUNTER — Ambulatory Visit (INDEPENDENT_AMBULATORY_CARE_PROVIDER_SITE_OTHER): Payer: BC Managed Care – PPO | Admitting: Emergency Medicine

## 2019-12-11 ENCOUNTER — Encounter: Payer: Self-pay | Admitting: Emergency Medicine

## 2019-12-11 ENCOUNTER — Other Ambulatory Visit: Payer: Self-pay

## 2019-12-11 VITALS — BP 133/89 | HR 59 | Temp 98.1°F | Resp 16 | Ht 73.0 in | Wt 208.0 lb

## 2019-12-11 DIAGNOSIS — Z0000 Encounter for general adult medical examination without abnormal findings: Secondary | ICD-10-CM

## 2019-12-11 DIAGNOSIS — Z130 Encounter for screening for diseases of the blood and blood-forming organs and certain disorders involving the immune mechanism: Secondary | ICD-10-CM

## 2019-12-11 DIAGNOSIS — Z0001 Encounter for general adult medical examination with abnormal findings: Secondary | ICD-10-CM | POA: Diagnosis not present

## 2019-12-11 DIAGNOSIS — Z1329 Encounter for screening for other suspected endocrine disorder: Secondary | ICD-10-CM | POA: Diagnosis not present

## 2019-12-11 DIAGNOSIS — I2510 Atherosclerotic heart disease of native coronary artery without angina pectoris: Secondary | ICD-10-CM

## 2019-12-11 DIAGNOSIS — E782 Mixed hyperlipidemia: Secondary | ICD-10-CM

## 2019-12-11 DIAGNOSIS — Z13228 Encounter for screening for other metabolic disorders: Secondary | ICD-10-CM

## 2019-12-11 DIAGNOSIS — Z789 Other specified health status: Secondary | ICD-10-CM

## 2019-12-11 DIAGNOSIS — K5909 Other constipation: Secondary | ICD-10-CM

## 2019-12-11 MED ORDER — EZETIMIBE 10 MG PO TABS: 10.0000 mg | ORAL_TABLET | Freq: Every day | ORAL | 3 refills | Status: DC

## 2019-12-11 NOTE — Progress Notes (Signed)
Victor Delgado 44 y.o.   Chief Complaint  Patient presents with  . Annual Exam    HISTORY OF PRESENT ILLNESS: This is a 44 y.o. male here for annual exam. Past medical history includes dyslipidemia and nonobstructive coronary artery disease.  Cardiac work-up from 2017 reviewed with patient. Stopped taking rosuvastatin due to side effects.  Has statin intolerance. Non-smoker.  Not taking baby aspirin. Recent colonoscopy within normal limits.  Advised to follow-up in 10 years.  Has history of chronic constipation.  Linzess helps. Has no complaints or medical concerns today.  HPI   Prior to Admission medications   Medication Sig Start Date End Date Taking? Authorizing Provider  Multiple Vitamin (MULTI VITAMIN MENS PO) Take by mouth daily.   Yes [provider]  linaclotide Karlene Einstein) 72 MCG capsule Take 1 capsule (72 mcg total) by mouth daily before breakfast. 07/22/18   Hilarie Fredrickson, MD  rosuvastatin (CRESTOR) 10 MG tablet Take 1 tablet (10 mg total) by mouth daily. Return in 3 months for follow up labs to evaluate med. Patient not taking: Reported on 08/21/2018 05/16/18   Georgina Quint, MD    No Known Allergies  Patient Active Problem List   Diagnosis Date Noted  . Chronic constipation 05/03/2018  . CAD (coronary artery disease) 07/26/2016  . Essential hypertension 04/26/2016  . Hyperlipidemia 04/26/2016  . Obesity (BMI 30-39.9) 04/26/2016    Past Medical History:  Diagnosis Date  . CAD (coronary artery disease) 07/26/2016  . Chest pain 03/09/2016  . Essential hypertension 04/26/2016  . Hyperlipidemia 04/26/2016  . Obesity (BMI 30-39.9) 04/26/2016    Past Surgical History:  Procedure Laterality Date  . arm surgery Left     Social History   Socioeconomic History  . Marital status: Married    Spouse name: Not on file  . Number of children: Not on file  . Years of education: Not on file  . Highest education level: Not on file  Occupational History    . Not on file  Tobacco Use  . Smoking status: Never Smoker  . Smokeless tobacco: Never Used  Substance and Sexual Activity  . Alcohol use: No    Alcohol/week: 0.0 standard drinks  . Drug use: No  . Sexual activity: Yes  Other Topics Concern  . Not on file  Social History Narrative  . Not on file   Social Determinants of Health   Financial Resource Strain:   . Difficulty of Paying Living Expenses:   Food Insecurity:   . Worried About Programme researcher, broadcasting/film/video in the Last Year:   . Barista in the Last Year:   Transportation Needs:   . Freight forwarder (Medical):   Marland Kitchen Lack of Transportation (Non-Medical):   Physical Activity:   . Days of Exercise per Week:   . Minutes of Exercise per Session:   Stress:   . Feeling of Stress :   Social Connections:   . Frequency of Communication with Friends and Family:   . Frequency of Social Gatherings with Friends and Family:   . Attends Religious Services:   . Active Member of Clubs or Organizations:   . Attends Banker Meetings:   Marland Kitchen Marital Status:   Intimate Partner Violence:   . Fear of Current or Ex-Partner:   . Emotionally Abused:   Marland Kitchen Physically Abused:   . Sexually Abused:     Family History  Problem Relation Age of Onset  . Hypertension Mother   .  Diabetes Mother   . Heart attack Paternal Uncle   . Stroke Maternal Grandmother   . Stroke Maternal Grandfather   . Heart attack Paternal Grandfather   . Esophageal cancer Neg Hx   . Pancreatic cancer Neg Hx   . Stomach cancer Neg Hx   . Liver disease Neg Hx   . Colon cancer Neg Hx      Review of Systems  Constitutional: Negative.  Negative for chills and fever.  HENT: Negative.  Negative for congestion and sore throat.   Respiratory: Negative.  Negative for cough and shortness of breath.   Cardiovascular: Negative.  Negative for chest pain and palpitations.  Gastrointestinal: Negative.  Negative for abdominal pain, diarrhea, nausea and vomiting.   Genitourinary: Negative.  Negative for dysuria and hematuria.  Musculoskeletal: Negative.  Negative for back pain, myalgias and neck pain.  Skin: Negative.  Negative for rash.  Neurological: Negative.  Negative for dizziness and headaches.  All other systems reviewed and are negative.  Today's Vitals   12/11/19 1003  BP: 133/89  Pulse: (!) 59  Resp: 16  Temp: 98.1 F (36.7 C)  TempSrc: Temporal  SpO2: 97%  Weight: 208 lb (94.3 kg)  Height: 6\' 1"  (1.854 m)   Body mass index is 27.44 kg/m.   Physical Exam Vitals reviewed.  Constitutional:      Appearance: Normal appearance.  HENT:     Head: Normocephalic.  Eyes:     Extraocular Movements: Extraocular movements intact.     Conjunctiva/sclera: Conjunctivae normal.     Pupils: Pupils are equal, round, and reactive to light.  Cardiovascular:     Rate and Rhythm: Normal rate and regular rhythm.     Pulses: Normal pulses.     Heart sounds: Normal heart sounds.  Pulmonary:     Effort: Pulmonary effort is normal.     Breath sounds: Normal breath sounds.  Abdominal:     General: Bowel sounds are normal. There is no distension.     Palpations: Abdomen is soft.     Tenderness: There is no abdominal tenderness.  Musculoskeletal:        General: Normal range of motion.     Cervical back: Normal range of motion and neck supple.     Right lower leg: No edema.     Left lower leg: No edema.  Skin:    General: Skin is warm and dry.     Capillary Refill: Capillary refill takes less than 2 seconds.  Neurological:     General: No focal deficit present.     Mental Status: He is alert and oriented to person, place, and time.  Psychiatric:        Mood and Affect: Mood normal.        Behavior: Behavior normal.      ASSESSMENT & PLAN: Victor NajjarLarry was seen today for annual exam.  Diagnoses and all orders for this visit:  Routine general medical examination at a health care facility  Mixed hyperlipidemia -     Lipid panel -      ezetimibe (ZETIA) 10 MG tablet; Take 1 tablet (10 mg total) by mouth daily.  Screening for deficiency anemia -     CBC with Differential/Platelet  Screening for endocrine, metabolic and immunity disorder -     Comprehensive metabolic panel -     TSH  Statin intolerance  Coronary artery disease involving native coronary artery of native heart without angina pectoris Comments: Nonobstructive  Chronic constipation  Patient Instructions       If you have lab work done today you will be contacted with your lab results within the next 2 weeks.  If you have not heard from [place] then please contact us. The fastest way to get your results is to register for My Chart.   IF you received an x-ray today, you will receive an invoice from [facility]. Please contact [facility] at [phone] with questions or concerns regarding your invoice.   IF you received labwork today, you will receive an invoice from [facility]. Please contact [facility] at [phone] with questions or concerns regarding your invoice.   Our billing staff will not be able to assist you with questions regarding bills from these companies.  You will be contacted with the lab results as soon as they are available. The fastest way to get your results is to activate your My Chart account. Instructions are located on the last page of this paperwork. If you have not heard from [place] regarding the results in 2 weeks, please contact this office.      Health Maintenance, Male Adopting a healthy lifestyle and getting preventive care are important in promoting health and wellness. Ask your health care provider about:  The right schedule for you to have regular tests and exams.  Things you can do on your own to prevent diseases and keep yourself healthy. What should I know about diet, weight, and exercise? Eat a healthy diet   Eat a diet that includes plenty of vegetables, fruits, low-fat dairy products, and  lean protein.  Do not eat a lot of foods that are high in solid fats, added sugars, or sodium. Maintain a healthy weight Body mass index (BMI) is a measurement that can be used to identify possible weight problems. It estimates body fat based on height and weight. Your health care provider can help determine your BMI and help you achieve or maintain a healthy weight. Get regular exercise Get regular exercise. This is one of the most important things you can do for your health. Most adults should:  Exercise for at least 150 minutes each week. The exercise should increase your heart rate and make you sweat (moderate-intensity exercise).  Do strengthening exercises at least twice a week. This is in addition to the moderate-intensity exercise.  Spend less time sitting. Even light physical activity can be beneficial. Watch cholesterol and blood lipids Have your blood tested for lipids and cholesterol at 42 years of age, then have this test every 5 years. You may need to have your cholesterol levels checked more often if:  Your lipid or cholesterol levels are high.  You are older than 43 years of age.  You are at high risk for heart disease. What should I know about cancer screening? Many types of cancers can be detected early and may often be prevented. Depending on your health history and family history, you may need to have cancer screening at various ages. This may include screening for:  Colorectal cancer.  Prostate cancer.  Skin cancer.  Lung cancer. What should I know about heart disease, diabetes, and high blood pressure? Blood pressure and heart disease  High blood pressure causes heart disease and increases the risk of stroke. This is more likely to develop in people who have high blood pressure readings, are of African descent, or are overweight.  Talk with your health care provider about your target blood pressure readings.  Have your blood pressure checked: ? Every 3-5  years if you are 41-49 years of age. ? Every year if you are 25 years old or older.  If you are between the ages of 54 and 5 and are a current or former smoker, ask your health care provider if you should have a one-time screening for abdominal aortic aneurysm (AAA). Diabetes Have regular diabetes screenings. This checks your fasting blood sugar level. Have the screening done:  Once every three years after age 58 if you are at a normal weight and have a low risk for diabetes.  More often and at a younger age if you are overweight or have a high risk for diabetes. What should I know about preventing infection? Hepatitis B If you have a higher risk for hepatitis B, you should be screened for this virus. Talk with your health care provider to find out if you are at risk for hepatitis B infection. Hepatitis C Blood testing is recommended for:  Everyone born from [birth year] through 1963.  Anyone with known risk factors for hepatitis C. Sexually transmitted infections (STIs)  You should be screened each year for STIs, including gonorrhea and chlamydia, if: ? You are sexually active and are younger than 42 years of age. ? You are older than 42 years of age and your health care provider tells you that you are at risk for this type of infection. ? Your sexual activity has changed since you were last screened, and you are at increased risk for chlamydia or gonorrhea. Ask your health care provider if you are at risk.  Ask your health care provider about whether you are at high risk for HIV. Your health care provider may recommend a prescription medicine to help prevent HIV infection. If you choose to take medicine to prevent HIV, you should first get tested for HIV. You should then be tested every 3 months for as long as you are taking the medicine. Follow these instructions at home: Lifestyle  Do not use any products that contain nicotine or tobacco, such as cigarettes, e-cigarettes, and chewing  tobacco. If you need help quitting, ask your health care provider.  Do not use street drugs.  Do not share needles.  Ask your health care provider for help if you need support or information about quitting drugs. Alcohol use  Do not drink alcohol if your health care provider tells you not to drink.  If you drink alcohol: ? Limit how much you have to 0-2 drinks a day. ? Be aware of how much alcohol is in your drink. In the [place] one drink equals one 12 oz bottle of beer (355 mL), one 5 oz glass of wine (148 mL), or one 1 oz glass of hard liquor (44 mL). General instructions  Schedule regular health, dental, and eye exams.  Stay current with your vaccines.  Tell your health care provider if: ? You often feel depressed. ? You have ever been abused or do not feel safe at home. Summary  Adopting a healthy lifestyle and getting preventive care are important in promoting health and wellness.  Follow your health care provider's instructions about healthy diet, exercising, and getting tested or screened for diseases.  Follow your health care provider's instructions on monitoring your cholesterol and blood pressure. This information is not intended to replace advice given to you by your health care provider. Make sure you discuss any questions you have with your health care provider. Document Revised: 07/17/2018 Document Reviewed: 07/17/2018 Elsevier Patient Education  2020 [employer].  High-Fiber  Diet Fiber, also called dietary fiber, is a type of carbohydrate that is found in fruits, vegetables, whole grains, and beans. A high-fiber diet can have many health benefits. Your health care provider may recommend a high-fiber diet to help:  Prevent constipation. Fiber can make your bowel movements more regular.  Lower your cholesterol.  Relieve the following conditions: ? Swelling of veins in the anus (hemorrhoids). ? Swelling and irritation (inflammation) of specific areas of the  digestive tract (uncomplicated diverticulosis). ? A problem of the large intestine (colon) that sometimes causes pain and diarrhea (irritable bowel syndrome, IBS).  Prevent overeating as part of a weight-loss plan.  Prevent heart disease, type 2 diabetes, and certain cancers. What is my plan? The recommended daily fiber intake in grams (g) includes:  38 g for men age 62 or younger.  30 g for men over age 49.  75 g for women age 41 or younger.  21 g for women over age 26. You can get the recommended daily intake of dietary fiber by:  Eating a variety of fruits, vegetables, grains, and beans.  Taking a fiber supplement, if it is not possible to get enough fiber through your diet. What do I need to know about a high-fiber diet?  It is better to get fiber through food sources rather than from fiber supplements. There is not a lot of research about how effective supplements are.  Always check the fiber content on the nutrition facts label of any prepackaged food. Look for foods that contain 5 g of fiber or more per serving.  Talk with a diet and nutrition specialist (dietitian) if you have questions about specific foods that are recommended or not recommended for your medical condition, especially if those foods are not listed below.  Gradually increase how much fiber you consume. If you increase your intake of dietary fiber too quickly, you may have bloating, cramping, or gas.  Drink plenty of water. Water helps you to digest fiber. What are tips for following this plan?  Eat a wide variety of high-fiber foods.  Make sure that half of the grains that you eat each day are whole grains.  Eat breads and cereals that are made with whole-grain flour instead of refined flour or white flour.  Eat brown rice, bulgur wheat, or millet instead of white rice.  Start the day with a breakfast that is high in fiber, such as a cereal that contains 5 g of fiber or more per serving.  Use beans  in place of meat in soups, salads, and pasta dishes.  Eat high-fiber snacks, such as berries, raw vegetables, nuts, and popcorn.  Choose whole fruits and vegetables instead of processed forms like juice or sauce. What foods can I eat?  Fruits Berries. Pears. Apples. Oranges. Avocado. Prunes and raisins. Dried figs. Vegetables Sweet potatoes. Spinach. Kale. Artichokes. Cabbage. Broccoli. Cauliflower. Green peas. Carrots. Squash. Grains Whole-grain breads. Multigrain cereal. Oats and oatmeal. Brown rice. Barley. Bulgur wheat. [place]. Quinoa. Bran muffins. Popcorn. Rye wafer crackers. Meats and other proteins Navy, kidney, and pinto beans. Soybeans. Split peas. Lentils. Nuts and seeds. Dairy Fiber-fortified yogurt. Beverages Fiber-fortified soy milk. Fiber-fortified orange juice. Other foods Fiber bars. The items listed above may not be a complete list of recommended foods and beverages. Contact a dietitian for more options. What foods are not recommended? Fruits Fruit juice. Cooked, strained fruit. Vegetables Fried potatoes. Canned vegetables. Well-cooked vegetables. Grains White bread. Pasta made with refined flour. White rice. Meats and other  proteins Fatty cuts of meat. Fried chicken or fried fish. Dairy Milk. Yogurt. Cream cheese. Sour cream. Fats and oils Butters. Beverages Soft drinks. Other foods Cakes and pastries. The items listed above may not be a complete list of foods and beverages to avoid. Contact a dietitian for more information. Summary  Fiber is a type of carbohydrate. It is found in fruits, vegetables, whole grains, and beans.  There are many health benefits of eating a high-fiber diet, such as preventing constipation, lowering blood cholesterol, helping with weight loss, and reducing your risk of heart disease, diabetes, and certain cancers.  Gradually increase your intake of fiber. Increasing too fast can result in cramping, bloating, and gas. Drink  plenty of water while you increase your fiber.  The best sources of fiber include whole fruits and vegetables, whole grains, nuts, seeds, and beans. This information is not intended to replace advice given to you by your health care provider. Make sure you discuss any questions you have with your health care provider. Document Revised: 05/28/2017 Document Reviewed: 05/28/2017 Elsevier Patient Education  2020 Else[employer] Inc[employer]      Edwina Barth, MD [facility]

## 2019-12-11 NOTE — Patient Instructions (Addendum)
   If you have lab work done today you will be contacted with your lab results within the next 2 weeks.  If you have not heard from us then please contact us. The fastest way to get your results is to register for My Chart.   IF you received an x-ray today, you will receive an invoice from [facility]. Please contact [facility] at [phone] with questions or concerns regarding your invoice.   IF you received labwork today, you will receive an invoice from [facility]. Please contact [facility] at [phone] with questions or concerns regarding your invoice.   Our billing staff will not be able to assist you with questions regarding bills from these companies.  You will be contacted with the lab results as soon as they are available. The fastest way to get your results is to activate your My Chart account. Instructions are located on the last page of this paperwork. If you have not heard from us regarding the results in 2 weeks, please contact this office.      Health Maintenance, Male Adopting a healthy lifestyle and getting preventive care are important in promoting health and wellness. Ask your health care provider about:  The right schedule for you to have regular tests and exams.  Things you can do on your own to prevent diseases and keep yourself healthy. What should I know about diet, weight, and exercise? Eat a healthy diet   Eat a diet that includes plenty of vegetables, fruits, low-fat dairy products, and lean protein.  Do not eat a lot of foods that are high in solid fats, added sugars, or sodium. Maintain a healthy weight Body mass index (BMI) is a measurement that can be used to identify possible weight problems. It estimates body fat based on height and weight. Your health care provider can help determine your BMI and help you achieve or maintain a healthy weight. Get regular exercise Get regular exercise. This is one of the most important things you  can do for your health. Most adults should:  Exercise for at least 150 minutes each week. The exercise should increase your heart rate and make you sweat (moderate-intensity exercise).  Do strengthening exercises at least twice a week. This is in addition to the moderate-intensity exercise.  Spend less time sitting. Even light physical activity can be beneficial. Watch cholesterol and blood lipids Have your blood tested for lipids and cholesterol at 42 years of age, then have this test every 5 years. You may need to have your cholesterol levels checked more often if:  Your lipid or cholesterol levels are high.  You are older than 42 years of age.  You are at high risk for heart disease. What should I know about cancer screening? Many types of cancers can be detected early and may often be prevented. Depending on your health history and family history, you may need to have cancer screening at various ages. This may include screening for:  Colorectal cancer.  Prostate cancer.  Skin cancer.  Lung cancer. What should I know about heart disease, diabetes, and high blood pressure? Blood pressure and heart disease  High blood pressure causes heart disease and increases the risk of stroke. This is more likely to develop in people who have high blood pressure readings, are of African descent, or are overweight.  Talk with your health care provider about your target blood pressure readings.  Have your blood pressure checked: ? Every 3-5 years if you are 18-39   years of age. ? Every year if you are 59 years old or older.  If you are between the ages of 25 and 15 and are a current or former smoker, ask your health care provider if you should have a one-time screening for abdominal aortic aneurysm (AAA). Diabetes Have regular diabetes screenings. This checks your fasting blood sugar level. Have the screening done:  Once every three years after age 79 if you are at a normal weight and have  a low risk for diabetes.  More often and at a younger age if you are overweight or have a high risk for diabetes. What should I know about preventing infection? Hepatitis B If you have a higher risk for hepatitis B, you should be screened for this virus. Talk with your health care provider to find out if you are at risk for hepatitis B infection. Hepatitis C Blood testing is recommended for:  Everyone born from [birth year] through 1963.  Anyone with known risk factors for hepatitis C. Sexually transmitted infections (STIs)  You should be screened each year for STIs, including gonorrhea and chlamydia, if: ? You are sexually active and are younger than 42 years of age. ? You are older than 42 years of age and your health care provider tells you that you are at risk for this type of infection. ? Your sexual activity has changed since you were last screened, and you are at increased risk for chlamydia or gonorrhea. Ask your health care provider if you are at risk.  Ask your health care provider about whether you are at high risk for HIV. Your health care provider may recommend a prescription medicine to help prevent HIV infection. If you choose to take medicine to prevent HIV, you should first get tested for HIV. You should then be tested every 3 months for as long as you are taking the medicine. Follow these instructions at home: Lifestyle  Do not use any products that contain nicotine or tobacco, such as cigarettes, e-cigarettes, and chewing tobacco. If you need help quitting, ask your health care provider.  Do not use street drugs.  Do not share needles.  Ask your health care provider for help if you need support or information about quitting drugs. Alcohol use  Do not drink alcohol if your health care provider tells you not to drink.  If you drink alcohol: ? Limit how much you have to 0-2 drinks a day. ? Be aware of how much alcohol is in your drink. In the [place] one drink equals one 12  oz bottle of beer (355 mL), one 5 oz glass of wine (148 mL), or one 1 oz glass of hard liquor (44 mL). General instructions  Schedule regular health, dental, and eye exams.  Stay current with your vaccines.  Tell your health care provider if: ? You often feel depressed. ? You have ever been abused or do not feel safe at home. Summary  Adopting a healthy lifestyle and getting preventive care are important in promoting health and wellness.  Follow your health care provider's instructions about healthy diet, exercising, and getting tested or screened for diseases.  Follow your health care provider's instructions on monitoring your cholesterol and blood pressure. This information is not intended to replace advice given to you by your health care provider. Make sure you discuss any questions you have with your health care provider. Document Revised: 07/17/2018 Document Reviewed: 07/17/2018 Elsevier Patient Education  [place].  High-Fiber Diet Fiber, also called  dietary fiber, is a type of carbohydrate that is found in fruits, vegetables, whole grains, and beans. A high-fiber diet can have many health benefits. Your health care provider may recommend a high-fiber diet to help:  Prevent constipation. Fiber can make your bowel movements more regular.  Lower your cholesterol.  Relieve the following conditions: ? Swelling of veins in the anus (hemorrhoids). ? Swelling and irritation (inflammation) of specific areas of the digestive tract (uncomplicated diverticulosis). ? A problem of the large intestine (colon) that sometimes causes pain and diarrhea (irritable bowel syndrome, IBS).  Prevent overeating as part of a weight-loss plan.  Prevent heart disease, type 2 diabetes, and certain cancers. What is my plan? The recommended daily fiber intake in grams (g) includes:  38 g for men age 80 or younger.  30 g for men over age 73.  25 g for women age 74 or younger.  21 g for  women over age 30. You can get the recommended daily intake of dietary fiber by:  Eating a variety of fruits, vegetables, grains, and beans.  Taking a fiber supplement, if it is not possible to get enough fiber through your diet. What do I need to know about a high-fiber diet?  It is better to get fiber through food sources rather than from fiber supplements. There is not a lot of research about how effective supplements are.  Always check the fiber content on the nutrition facts label of any prepackaged food. Look for foods that contain 5 g of fiber or more per serving.  Talk with a diet and nutrition specialist (dietitian) if you have questions about specific foods that are recommended or not recommended for your medical condition, especially if those foods are not listed below.  Gradually increase how much fiber you consume. If you increase your intake of dietary fiber too quickly, you may have bloating, cramping, or gas.  Drink plenty of water. Water helps you to digest fiber. What are tips for following this plan?  Eat a wide variety of high-fiber foods.  Make sure that half of the grains that you eat each day are whole grains.  Eat breads and cereals that are made with whole-grain flour instead of refined flour or white flour.  Eat brown rice, bulgur wheat, or millet instead of white rice.  Start the day with a breakfast that is high in fiber, such as a cereal that contains 5 g of fiber or more per serving.  Use beans in place of meat in soups, salads, and pasta dishes.  Eat high-fiber snacks, such as berries, raw vegetables, nuts, and popcorn.  Choose whole fruits and vegetables instead of processed forms like juice or sauce. What foods can I eat?  Fruits Berries. Pears. Apples. Oranges. Avocado. Prunes and raisins. Dried figs. Vegetables Sweet potatoes. Spinach. Kale. Artichokes. Cabbage. Broccoli. Cauliflower. Green peas. Carrots. Squash. Grains Whole-grain breads.  Multigrain cereal. Oats and oatmeal. Brown rice. Barley. Bulgur wheat. Millet. Quinoa. Bran muffins. Popcorn. Rye wafer crackers. Meats and other proteins Navy, kidney, and pinto beans. Soybeans. Split peas. Lentils. Nuts and seeds. Dairy Fiber-fortified yogurt. Beverages Fiber-fortified soy milk. Fiber-fortified orange juice. Other foods Fiber bars. The items listed above may not be a complete list of recommended foods and beverages. Contact a dietitian for more options. What foods are not recommended? Fruits Fruit juice. Cooked, strained fruit. Vegetables Fried potatoes. Canned vegetables. Well-cooked vegetables. Grains White bread. Pasta made with refined flour. White rice. Meats and other proteins Fatty cuts of  meat. Fried chicken or fried fish. Dairy Milk. Yogurt. Cream cheese. Sour cream. Fats and oils Butters. Beverages Soft drinks. Other foods Cakes and pastries. The items listed above may not be a complete list of foods and beverages to avoid. Contact a dietitian for more information. Summary  Fiber is a type of carbohydrate. It is found in fruits, vegetables, whole grains, and beans.  There are many health benefits of eating a high-fiber diet, such as preventing constipation, lowering blood cholesterol, helping with weight loss, and reducing your risk of heart disease, diabetes, and certain cancers.  Gradually increase your intake of fiber. Increasing too fast can result in cramping, bloating, and gas. Drink plenty of water while you increase your fiber.  The best sources of fiber include whole fruits and vegetables, whole grains, nuts, seeds, and beans. This information is not intended to replace advice given to you by your health care provider. Make sure you discuss any questions you have with your health care provider. Document Revised: 05/28/2017 Document Reviewed: 05/28/2017 Elsevier Patient Education  2020 [employer].

## 2019-12-12 LAB — COMPREHENSIVE METABOLIC PANEL
ALT: 65 IU/L — ABNORMAL HIGH (ref 0–44)
AST: 39 IU/L (ref 0–40)
Albumin/Globulin Ratio: 1.7 (ref 1.2–2.2)
Albumin: 4.7 g/dL (ref 4.0–5.0)
Alkaline Phosphatase: 87 IU/L (ref 39–117)
BUN/Creatinine Ratio: 15 (ref 9–20)
BUN: 14 mg/dL (ref 6–24)
Bilirubin Total: 0.6 mg/dL (ref 0.0–1.2)
CO2: 22 mmol/L (ref 20–29)
Calcium: 9.5 mg/dL (ref 8.7–10.2)
Chloride: 104 mmol/L (ref 96–106)
Creatinine, Ser: 0.93 mg/dL (ref 0.76–1.27)
GFR calc Af Amer: 117 mL/min/{1.73_m2} (ref >59)
GFR calc non Af Amer: 101 mL/min/{1.73_m2} (ref >59)
Globulin, Total: 2.7 g/dL (ref 1.5–4.5)
Glucose: 93 mg/dL (ref 65–99)
Potassium: 4.4 mmol/L (ref 3.5–5.2)
Sodium: 142 mmol/L (ref 134–144)
Total Protein: 7.4 g/dL (ref 6.0–8.5)

## 2019-12-12 LAB — CBC WITH DIFFERENTIAL/PLATELET
Basophils Absolute: 0 10*3/uL (ref 0.0–0.2)
Basos: 0 %
EOS (ABSOLUTE): 0.2 10*3/uL (ref 0.0–0.4)
Eos: 3 %
Hematocrit: 51.7 % — ABNORMAL HIGH (ref 37.5–51.0)
Hemoglobin: 17.1 g/dL (ref 13.0–17.7)
Immature Grans (Abs): 0 10*3/uL (ref 0.0–0.1)
Immature Granulocytes: 0 %
Lymphocytes Absolute: 1.8 10*3/uL (ref 0.7–3.1)
Lymphs: 27 %
MCH: 30.5 pg (ref 26.6–33.0)
MCHC: 33.1 g/dL (ref 31.5–35.7)
MCV: 92 fL (ref 79–97)
Monocytes Absolute: 0.4 10*3/uL (ref 0.1–0.9)
Monocytes: 7 %
Neutrophils Absolute: 4.2 10*3/uL (ref 1.4–7.0)
Neutrophils: 63 %
Platelets: 287 10*3/uL (ref 150–450)
RBC: 5.6 x10E6/uL (ref 4.14–5.80)
RDW: 12.8 % (ref 11.6–15.4)
WBC: 6.7 10*3/uL (ref 3.4–10.8)

## 2019-12-12 LAB — LIPID PANEL
Chol/HDL Ratio: 5.3 ratio — ABNORMAL HIGH (ref 0.0–5.0)
Cholesterol, Total: 243 mg/dL — ABNORMAL HIGH (ref 100–199)
HDL: 46 mg/dL (ref >39)
LDL Chol Calc (NIH): 172 mg/dL — ABNORMAL HIGH (ref 0–99)
Triglycerides: 137 mg/dL (ref 0–149)
VLDL Cholesterol Cal: 25 mg/dL (ref 5–40)

## 2019-12-12 LAB — TSH: TSH: 2.57 u[IU]/mL (ref 0.450–4.500)

## 2019-12-13 ENCOUNTER — Encounter: Payer: Self-pay | Admitting: Emergency Medicine

## 2020-03-16 ENCOUNTER — Ambulatory Visit (INDEPENDENT_AMBULATORY_CARE_PROVIDER_SITE_OTHER): Payer: BC Managed Care – PPO

## 2020-03-16 ENCOUNTER — Other Ambulatory Visit: Payer: Self-pay

## 2020-03-16 ENCOUNTER — Ambulatory Visit: Payer: BC Managed Care – PPO | Admitting: Emergency Medicine

## 2020-03-16 ENCOUNTER — Encounter: Payer: Self-pay | Admitting: Emergency Medicine

## 2020-03-16 VITALS — BP 125/81 | HR 79 | Temp 98.1°F | Resp 16 | Ht 73.0 in | Wt 280.0 lb

## 2020-03-16 DIAGNOSIS — M25561 Pain in right knee: Secondary | ICD-10-CM

## 2020-03-16 DIAGNOSIS — Z6836 Body mass index (BMI) 36.0-36.9, adult: Secondary | ICD-10-CM | POA: Diagnosis not present

## 2020-03-16 DIAGNOSIS — Z789 Other specified health status: Secondary | ICD-10-CM

## 2020-03-16 DIAGNOSIS — E782 Mixed hyperlipidemia: Secondary | ICD-10-CM | POA: Diagnosis not present

## 2020-03-16 NOTE — Patient Instructions (Addendum)
Stop Zetia and monitor symptoms. Call the office if not improved after 3-4 weeks.  Acute Knee Pain, Adult Many things can cause knee pain. Sometimes, knee pain is sudden (acute) and may be caused by damage, swelling, or irritation of the muscles and tissues that support your knee. The pain often goes away on its own with time and rest. If the pain does not go away, tests may be done to find out what is causing the pain. Follow these instructions at home: Pay attention to any changes in your symptoms. Take these actions to relieve your pain. If you have a knee sleeve or brace:   Wear the sleeve or brace as told by your doctor. Remove it only as told by your doctor.  Loosen the sleeve or brace if your toes: ? Tingle. ? Become numb. ? Turn cold and blue.  Keep the sleeve or brace clean.  If the sleeve or brace is not waterproof: ? Do not let it get wet. ? Cover it with a watertight covering when you take a bath or shower. Activity  Rest your knee.  Do not do things that cause pain.  Avoid activities where both feet leave the ground at the same time (high-impact activities). Examples are running, jumping rope, and doing jumping jacks.  Work with a physical therapist to make a safe exercise program, as told by your doctor. Managing pain, stiffness, and swelling   If told, put ice on the knee: ? Put ice in a plastic bag. ? Place a towel between your skin and the bag. ? Leave the ice on for 20 minutes, 2-3 times a day.  If told, put pressure (compression) on your injured knee to control swelling, give support, and help with discomfort. Compression may be done with an elastic bandage. General instructions  Take all medicines only as told by your doctor.  Raise (elevate) your knee while you are sitting or lying down. Make sure your knee is higher than your heart.  Sleep with a pillow under your knee.  Do not use any products that contain nicotine or tobacco. These include  cigarettes, e-cigarettes, and chewing tobacco. These products may slow down healing. If you need help quitting, ask your doctor.  If you are overweight, work with your doctor and a food expert (dietitian) to set goals to lose weight. Being overweight can make your knee hurt more.  Keep all follow-up visits as told by your doctor. This is important. Contact a doctor if:  The knee pain does not stop.  The knee pain changes or gets worse.  You have a fever along with knee pain.  Your knee feels warm when you touch it.  Your knee gives out or locks up. Get help right away if:  Your knee swells, and the swelling gets worse.  You cannot move your knee.  You have very bad knee pain. Summary  Many things can cause knee pain. The pain often goes away on its own with time and rest.  Your doctor may do tests to find out the cause of the pain.  Pay attention to any changes in your symptoms. Relieve your pain with rest, medicines, light activity, and use of ice.  Get help right away if you cannot move your knee or your knee pain is very bad. This information is not intended to replace advice given to you by your health care provider. Make sure you discuss any questions you have with your health care provider. Document Revised: 01/03/2018  Document Reviewed: 01/03/2018 Elsevier Patient Education  [employer].    If you have lab work done today you will be contacted with your lab results within the next 2 weeks.  If you have not heard from [place] then please contact us. The fastest way to get your results is to register for My Chart.   IF you received an x-ray today, you will receive an invoice from [facility]. Please contact [facility] at [phone] with questions or concerns regarding your invoice.   IF you received labwork today, you will receive an invoice from [facility]. Please contact [facility] at [phone] with questions or concerns regarding your invoice.    Our billing staff will not be able to assist you with questions regarding bills from these companies.  You will be contacted with the lab results as soon as they are available. The fastest way to get your results is to activate your My Chart account. Instructions are located on the last page of this paperwork. If you have not heard from [place] regarding the results in 2 weeks, please contact this office.

## 2020-03-16 NOTE — Progress Notes (Signed)
Victor Delgado 44 y.o.   Chief Complaint  Patient presents with  . Knee Pain    RIGHT - per patient with numbness with burning and pain with bending that started mid April 2021    HISTORY OF PRESENT ILLNESS: This is a 44 y.o. male complaining of right knee pain that started last April along with numbness along outer aspect of right thigh with burning sensation to the muscle.  Symptoms started at the same time he was started on Zetia 10 mg daily for cholesterol.  Denies any injuries or any other associated symptoms.  HPI   Prior to Admission medications   Medication Sig Start Date End Date Taking? Authorizing Provider  ezetimibe (ZETIA) 10 MG tablet Take 1 tablet (10 mg total) by mouth daily. 12/11/19  Yes Duane Earnshaw, Eilleen KempfMiguel Jose, MD  Multiple Vitamin (MULTI VITAMIN MENS PO) Take by mouth daily.   Yes [provider]  linaclotide Karlene Einstein(LINZESS) 72 MCG capsule Take 1 capsule (72 mcg total) by mouth daily before breakfast. 07/22/18   Hilarie FredricksonPerry, John N, MD    No Known Allergies  Patient Active Problem List   Diagnosis Date Noted  . Chronic constipation 05/03/2018  . CAD (coronary artery disease) 07/26/2016  . Essential hypertension 04/26/2016  . Hyperlipidemia 04/26/2016  . Obesity (BMI 30-39.9) 04/26/2016    Past Medical History:  Diagnosis Date  . CAD (coronary artery disease) 07/26/2016  . Chest pain 03/09/2016  . Essential hypertension 04/26/2016  . Hyperlipidemia 04/26/2016  . Obesity (BMI 30-39.9) 04/26/2016    Past Surgical History:  Procedure Laterality Date  . APPENDECTOMY  2002  . arm surgery Right    per patient right and 20 years ago    Social History   Socioeconomic History  . Marital status: Married    Spouse name: Not on file  . Number of children: Not on file  . Years of education: Not on file  . Highest education level: Not on file  Occupational History  . Not on file  Tobacco Use  . Smoking status: Never Smoker  . Smokeless tobacco: Never Used   Substance and Sexual Activity  . Alcohol use: No    Alcohol/week: 0.0 standard drinks  . Drug use: No  . Sexual activity: Yes  Other Topics Concern  . Not on file  Social History Narrative  . Not on file   Social Determinants of Health   Financial Resource Strain:   . Difficulty of Paying Living Expenses:   Food Insecurity:   . Worried About Programme researcher, broadcasting/film/videounning Out of Food in the Last Year:   . Baristaan Out of Food in the Last Year:   Transportation Needs:   . Freight forwarderLack of Transportation (Medical):   Marland Kitchen. Lack of Transportation (Non-Medical):   Physical Activity:   . Days of Exercise per Week:   . Minutes of Exercise per Session:   Stress:   . Feeling of Stress :   Social Connections:   . Frequency of Communication with Friends and Family:   . Frequency of Social Gatherings with Friends and Family:   . Attends Religious Services:   . Active Member of Clubs or Organizations:   . Attends BankerClub or Organization Meetings:   Marland Kitchen. Marital Status:   Intimate Partner Violence:   . Fear of Current or Ex-Partner:   . Emotionally Abused:   Marland Kitchen. Physically Abused:   . Sexually Abused:     Family History  Problem Relation Age of Onset  . Hypertension Mother   .  Diabetes Mother   . Heart attack Paternal Uncle   . Stroke Maternal Grandmother   . Stroke Maternal Grandfather   . Heart attack Paternal Grandfather   . Esophageal cancer Neg Hx   . Pancreatic cancer Neg Hx   . Stomach cancer Neg Hx   . Liver disease Neg Hx   . Colon cancer Neg Hx      Review of Systems  Constitutional: Negative.  Negative for chills and fever.  HENT: Negative.  Negative for congestion and sore throat.   Respiratory: Negative.  Negative for cough and shortness of breath.   Cardiovascular: Negative.  Negative for chest pain and palpitations.  Gastrointestinal: Negative.  Negative for abdominal pain, diarrhea, nausea and vomiting.  Genitourinary: Negative.  Negative for dysuria and hematuria.  Musculoskeletal: Negative for  back pain and myalgias.  Skin: Negative.  Negative for rash.  Neurological: Negative.  Negative for dizziness and headaches.  All other systems reviewed and are negative.  Today's Vitals   03/16/20 1443  BP: 125/81  Pulse: 79  Resp: 16  Temp: 98.1 F (36.7 C)  TempSrc: Temporal  SpO2: 96%  Weight: 280 lb (127 kg)  Height: 6\' 1"  (1.854 m)   Body mass index is 36.94 kg/m.   Physical Exam Vitals reviewed.  Constitutional:      Appearance: Normal appearance.  HENT:     Head: Normocephalic.  Eyes:     Extraocular Movements: Extraocular movements intact.     Pupils: Pupils are equal, round, and reactive to light.  Cardiovascular:     Rate and Rhythm: Normal rate.  Pulmonary:     Effort: Pulmonary effort is normal.  Musculoskeletal:     Cervical back: Normal range of motion.     Comments: Right knee: No erythema or swelling.  No ecchymosis.  No localized tenderness.  Full range of motion.  Stable in flexion and extension. Right upper extremity: Thigh area does not show any redness or significant tenderness.  Within normal limits. Rest of extremities within normal limits and full range of motion.  Skin:    General: Skin is warm and dry.     Capillary Refill: Capillary refill takes less than 2 seconds.  Neurological:     General: No focal deficit present.     Mental Status: He is alert and oriented to person, place, and time.  Psychiatric:        Mood and Affect: Mood normal.        Behavior: Behavior normal.    DG Knee Complete 4 Views Right  Result Date: 03/16/2020 CLINICAL DATA:  Acute right knee pain. EXAM: RIGHT KNEE - COMPLETE 4+ VIEW COMPARISON:  None. FINDINGS: No acute fracture or dislocation. Small joint effusion. Joint spaces are preserved. Bone mineralization is normal. Soft tissues are unremarkable. IMPRESSION: 1. Small joint effusion. No acute osseous abnormality or significant degenerative changes. Electronically Signed   By: 05/16/2020 M.D.   On:  03/16/2020 15:47     ASSESSMENT & PLAN: [name] was seen today for knee pain.  Diagnoses and all orders for this visit:  Acute pain of right knee -     DG Knee Complete 4 Views Right -     CK -     CBC with Differential/Platelet -     Comprehensive metabolic panel  Statin intolerance  Body mass index (BMI) of 36.0-36.9 in adult  Mixed hyperlipidemia    Patient Instructions    Stop Zetia and monitor symptoms. Call the office  if not improved after 3-4 weeks.  Acute Knee Pain, Adult Many things can cause knee pain. Sometimes, knee pain is sudden (acute) and may be caused by damage, swelling, or irritation of the muscles and tissues that support your knee. The pain often goes away on its own with time and rest. If the pain does not go away, tests may be done to find out what is causing the pain. Follow these instructions at home: Pay attention to any changes in your symptoms. Take these actions to relieve your pain. If you have a knee sleeve or brace:   Wear the sleeve or brace as told by your doctor. Remove it only as told by your doctor.  Loosen the sleeve or brace if your toes: ? Tingle. ? Become numb. ? Turn cold and blue.  Keep the sleeve or brace clean.  If the sleeve or brace is not waterproof: ? Do not let it get wet. ? Cover it with a watertight covering when you take a bath or shower. Activity  Rest your knee.  Do not do things that cause pain.  Avoid activities where both feet leave the ground at the same time (high-impact activities). Examples are running, jumping rope, and doing jumping jacks.  Work with a physical therapist to make a safe exercise program, as told by your doctor. Managing pain, stiffness, and swelling   If told, put ice on the knee: ? Put ice in a plastic bag. ? Place a towel between your skin and the bag. ? Leave the ice on for 20 minutes, 2-3 times a day.  If told, put pressure (compression) on your injured knee to control  swelling, give support, and help with discomfort. Compression may be done with an elastic bandage. General instructions  Take all medicines only as told by your doctor.  Raise (elevate) your knee while you are sitting or lying down. Make sure your knee is higher than your heart.  Sleep with a pillow under your knee.  Do not use any products that contain nicotine or tobacco. These include cigarettes, e-cigarettes, and chewing tobacco. These products may slow down healing. If you need help quitting, ask your doctor.  If you are overweight, work with your doctor and a food expert (dietitian) to set goals to lose weight. Being overweight can make your knee hurt more.  Keep all follow-up visits as told by your doctor. This is important. Contact a doctor if:  The knee pain does not stop.  The knee pain changes or gets worse.  You have a fever along with knee pain.  Your knee feels warm when you touch it.  Your knee gives out or locks up. Get help right away if:  Your knee swells, and the swelling gets worse.  You cannot move your knee.  You have very bad knee pain. Summary  Many things can cause knee pain. The pain often goes away on its own with time and rest.  Your doctor may do tests to find out the cause of the pain.  Pay attention to any changes in your symptoms. Relieve your pain with rest, medicines, light activity, and use of ice.  Get help right away if you cannot move your knee or your knee pain is very bad. This information is not intended to replace advice given to you by your health care provider. Make sure you discuss any questions you have with your health care provider. Document Revised: 01/03/2018 Document Reviewed: 01/03/2018 Elsevier Patient Education  2020  [employer].    If you have lab work done today you will be contacted with your lab results within the next 2 weeks.  If you have not heard from [place] then please contact us. The fastest way to get your  results is to register for My Chart.   IF you received an x-ray today, you will receive an invoice from [facility]. Please contact [facility] at [phone] with questions or concerns regarding your invoice.   IF you received labwork today, you will receive an invoice from [facility]. Please contact [facility] at [phone] with questions or concerns regarding your invoice.   Our billing staff will not be able to assist you with questions regarding bills from these companies.  You will be contacted with the lab results as soon as they are available. The fastest way to get your results is to activate your My Chart account. Instructions are located on the last page of this paperwork. If you have not heard from [place] regarding the results in 2 weeks, please contact this office.         Edwina Barth, MD [facility]

## 2020-03-17 LAB — COMPREHENSIVE METABOLIC PANEL
ALT: 42 IU/L (ref 0–44)
AST: 27 IU/L (ref 0–40)
Albumin/Globulin Ratio: 1.7 (ref 1.2–2.2)
Albumin: 4.6 g/dL (ref 4.0–5.0)
Alkaline Phosphatase: 83 IU/L (ref 48–121)
BUN/Creatinine Ratio: 11 (ref 9–20)
BUN: 11 mg/dL (ref 6–24)
Bilirubin Total: 0.2 mg/dL (ref 0.0–1.2)
CO2: 26 mmol/L (ref 20–29)
Calcium: 9.8 mg/dL (ref 8.7–10.2)
Chloride: 104 mmol/L (ref 96–106)
Creatinine, Ser: 0.98 mg/dL (ref 0.76–1.27)
GFR calc Af Amer: 109 mL/min/{1.73_m2} (ref >59)
GFR calc non Af Amer: 95 mL/min/{1.73_m2} (ref >59)
Globulin, Total: 2.7 g/dL (ref 1.5–4.5)
Glucose: 71 mg/dL (ref 65–99)
Potassium: 4.3 mmol/L (ref 3.5–5.2)
Sodium: 143 mmol/L (ref 134–144)
Total Protein: 7.3 g/dL (ref 6.0–8.5)

## 2020-03-17 LAB — CBC WITH DIFFERENTIAL/PLATELET
Basophils Absolute: 0 10*3/uL (ref 0.0–0.2)
Basos: 0 %
EOS (ABSOLUTE): 0.2 10*3/uL (ref 0.0–0.4)
Eos: 2 %
Hematocrit: 50.1 % (ref 37.5–51.0)
Hemoglobin: 16.8 g/dL (ref 13.0–17.7)
Immature Grans (Abs): 0 10*3/uL (ref 0.0–0.1)
Immature Granulocytes: 0 %
Lymphocytes Absolute: 2.1 10*3/uL (ref 0.7–3.1)
Lymphs: 27 %
MCH: 31.2 pg (ref 26.6–33.0)
MCHC: 33.5 g/dL (ref 31.5–35.7)
MCV: 93 fL (ref 79–97)
Monocytes Absolute: 0.5 10*3/uL (ref 0.1–0.9)
Monocytes: 6 %
Neutrophils Absolute: 4.8 10*3/uL (ref 1.4–7.0)
Neutrophils: 65 %
Platelets: 299 10*3/uL (ref 150–450)
RBC: 5.39 x10E6/uL (ref 4.14–5.80)
RDW: 13 % (ref 11.6–15.4)
WBC: 7.5 10*3/uL (ref 3.4–10.8)

## 2020-03-17 LAB — CK: Total CK: 185 U/L (ref 49–439)

## 2020-12-14 ENCOUNTER — Ambulatory Visit (INDEPENDENT_AMBULATORY_CARE_PROVIDER_SITE_OTHER): Payer: BC Managed Care – PPO | Admitting: Emergency Medicine

## 2020-12-14 ENCOUNTER — Encounter: Payer: Self-pay | Admitting: Emergency Medicine

## 2020-12-14 ENCOUNTER — Other Ambulatory Visit: Payer: Self-pay

## 2020-12-14 VITALS — BP 122/80 | HR 67 | Ht 73.0 in | Wt 285.0 lb

## 2020-12-14 DIAGNOSIS — Z1329 Encounter for screening for other suspected endocrine disorder: Secondary | ICD-10-CM | POA: Diagnosis not present

## 2020-12-14 DIAGNOSIS — Z13220 Encounter for screening for lipoid disorders: Secondary | ICD-10-CM

## 2020-12-14 DIAGNOSIS — Z130 Encounter for screening for diseases of the blood and blood-forming organs and certain disorders involving the immune mechanism: Secondary | ICD-10-CM | POA: Diagnosis not present

## 2020-12-14 DIAGNOSIS — G720 Drug-induced myopathy: Secondary | ICD-10-CM

## 2020-12-14 DIAGNOSIS — Z0000 Encounter for general adult medical examination without abnormal findings: Secondary | ICD-10-CM

## 2020-12-14 DIAGNOSIS — E785 Hyperlipidemia, unspecified: Secondary | ICD-10-CM | POA: Diagnosis not present

## 2020-12-14 DIAGNOSIS — M79671 Pain in right foot: Secondary | ICD-10-CM

## 2020-12-14 DIAGNOSIS — I2510 Atherosclerotic heart disease of native coronary artery without angina pectoris: Secondary | ICD-10-CM

## 2020-12-14 DIAGNOSIS — Z13228 Encounter for screening for other metabolic disorders: Secondary | ICD-10-CM | POA: Diagnosis not present

## 2020-12-14 DIAGNOSIS — T466X5A Adverse effect of antihyperlipidemic and antiarteriosclerotic drugs, initial encounter: Secondary | ICD-10-CM

## 2020-12-14 LAB — HEMOGLOBIN A1C: Hgb A1c MFr Bld: 5.5 % (ref 4.6–6.5)

## 2020-12-14 LAB — COMPREHENSIVE METABOLIC PANEL
ALT: 50 U/L (ref 0–53)
AST: 27 U/L (ref 0–37)
Albumin: 4.5 g/dL (ref 3.5–5.2)
Alkaline Phosphatase: 70 U/L (ref 39–117)
BUN: 15 mg/dL (ref 6–23)
CO2: 28 mEq/L (ref 19–32)
Calcium: 9.7 mg/dL (ref 8.4–10.5)
Chloride: 103 mEq/L (ref 96–112)
Creatinine, Ser: 0.87 mg/dL (ref 0.40–1.50)
GFR: 105.92 mL/min (ref >60.00)
Glucose, Bld: 94 mg/dL (ref 70–99)
Potassium: 4.2 mEq/L (ref 3.5–5.1)
Sodium: 139 mEq/L (ref 135–145)
Total Bilirubin: 0.7 mg/dL (ref 0.2–1.2)
Total Protein: 7.6 g/dL (ref 6.0–8.3)

## 2020-12-14 LAB — CBC WITH DIFFERENTIAL/PLATELET
Basophils Absolute: 0 10*3/uL (ref 0.0–0.1)
Basophils Relative: 0.5 % (ref 0.0–3.0)
Eosinophils Absolute: 0.2 10*3/uL (ref 0.0–0.7)
Eosinophils Relative: 3.1 % (ref 0.0–5.0)
HCT: 47.7 % (ref 39.0–52.0)
Hemoglobin: 16.4 g/dL (ref 13.0–17.0)
Lymphocytes Relative: 28 % (ref 12.0–46.0)
Lymphs Abs: 1.8 10*3/uL (ref 0.7–4.0)
MCHC: 34.3 g/dL (ref 30.0–36.0)
MCV: 91 fl (ref 78.0–100.0)
Monocytes Absolute: 0.4 10*3/uL (ref 0.1–1.0)
Monocytes Relative: 6.8 % (ref 3.0–12.0)
Neutro Abs: 3.9 10*3/uL (ref 1.4–7.7)
Neutrophils Relative %: 61.6 % (ref 43.0–77.0)
Platelets: 274 10*3/uL (ref 150.0–400.0)
RBC: 5.25 Mil/uL (ref 4.22–5.81)
RDW: 13.1 % (ref 11.5–15.5)
WBC: 6.3 10*3/uL (ref 4.0–10.5)

## 2020-12-14 LAB — LIPID PANEL
Cholesterol: 260 mg/dL — ABNORMAL HIGH (ref 0–200)
HDL: 44.6 mg/dL (ref >39.00)
LDL Cholesterol: 183 mg/dL — ABNORMAL HIGH (ref 0–99)
NonHDL: 215.08
Total CHOL/HDL Ratio: 6
Triglycerides: 160 mg/dL — ABNORMAL HIGH (ref 0.0–149.0)
VLDL: 32 mg/dL (ref 0.0–40.0)

## 2020-12-14 NOTE — Progress Notes (Signed)
Victor Delgado 45 y.o.   Chief Complaint  Patient presents with  . Annual Exam    HISTORY OF PRESENT ILLNESS: This is a 45 y.o. male here for his annual exam. Has the following chronic medical problems: Past medical history includes dyslipidemia and nonobstructive coronary artery disease.  Cardiac work-up from 2017 reviewed with patient. Stopped taking rosuvastatin due to side effects.  Has statin intolerance. Non-smoker.  Not taking baby aspirin. Recent colonoscopy within normal limits.  Advised to follow-up in 10 years.  Has history of chronic constipation.  Linzess helps. Hypertension and dyslipidemia.  On no medications. Intolerant to statins and Zetia. Has chronic constipation. No significant family history of cancer. Physically active at work. Complaining of right shoulder and right arm pain for the past 2 to 3 weeks triggered by 50 pound puppy pulling on him during walks. Also complaining of right heel pain that started about 2 months ago following an injury during which he heard a "pop".  Injury happened while pain playing lacrosse.  Reinjured it again several days ago while playing basketball.  Has remote history of bilateral plantar fasciitis surgeries.  Needs podiatry referral. No other complaints or medical concerns today.  HPI   Prior to Admission medications   Medication Sig Start Date End Date Taking? Authorizing Provider  ezetimibe (ZETIA) 10 MG tablet Take 1 tablet (10 mg total) by mouth daily. 12/11/19   Georgina Quint, MD  Multiple Vitamin (MULTI VITAMIN MENS PO) Take by mouth daily.    [provider]    No Known Allergies  Patient Active Problem List   Diagnosis Date Noted  . Chronic constipation 05/03/2018  . CAD (coronary artery disease) 07/26/2016  . Essential hypertension 04/26/2016  . Hyperlipidemia 04/26/2016  . Obesity (BMI 30-39.9) 04/26/2016    Past Medical History:  Diagnosis Date  . CAD (coronary artery disease) 07/26/2016   . Chest pain 03/09/2016  . Essential hypertension 04/26/2016  . Hyperlipidemia 04/26/2016  . Obesity (BMI 30-39.9) 04/26/2016    Past Surgical History:  Procedure Laterality Date  . APPENDECTOMY  2002  . arm surgery Right    per patient right and 20 years ago    Social History   Socioeconomic History  . Marital status: Married    Spouse name: Not on file  . Number of children: Not on file  . Years of education: Not on file  . Highest education level: Not on file  Occupational History  . Not on file  Tobacco Use  . Smoking status: Never Smoker  . Smokeless tobacco: Never Used  Substance and Sexual Activity  . Alcohol use: No    Alcohol/week: 0.0 standard drinks  . Drug use: No  . Sexual activity: Yes  Other Topics Concern  . Not on file  Social History Narrative  . Not on file   Social Determinants of Health   Financial Resource Strain: Not on file  Food Insecurity: Not on file  Transportation Needs: Not on file  Physical Activity: Not on file  Stress: Not on file  Social Connections: Not on file  Intimate Partner Violence: Not on file    Family History  Problem Relation Age of Onset  . Hypertension Mother   . Diabetes Mother   . Heart attack Paternal Uncle   . Stroke Maternal Grandmother   . Stroke Maternal Grandfather   . Heart attack Paternal Grandfather   . Esophageal cancer Neg Hx   . Pancreatic cancer Neg Hx   .  Stomach cancer Neg Hx   . Liver disease Neg Hx   . Colon cancer Neg Hx      Review of Systems  Constitutional: Negative.  Negative for chills and fever.  HENT: Negative.  Negative for congestion and sore throat.   Respiratory: Negative.  Negative for cough and shortness of breath.   Cardiovascular: Negative.  Negative for chest pain and palpitations.  Gastrointestinal: Positive for constipation. Negative for abdominal pain, blood in stool, diarrhea, melena, nausea and vomiting.  Genitourinary: Negative.  Negative for dysuria and  hematuria.  Skin: Negative.  Negative for rash.  Neurological: Negative.  Negative for dizziness and headaches.  All other systems reviewed and are negative.  Vitals:   12/14/20 0806  BP: 122/80  Pulse: 67  SpO2: 98%   Wt Readings from Last 3 Encounters:  12/14/20 285 lb (129.3 kg)  03/16/20 280 lb (127 kg)  12/11/19 208 lb (94.3 kg)     Physical Exam Vitals reviewed.  Constitutional:      Appearance: Normal appearance.  HENT:     Head: Normocephalic.     Right Ear: Tympanic membrane, ear canal and external ear normal.     Left Ear: Tympanic membrane, ear canal and external ear normal.  Eyes:     Extraocular Movements: Extraocular movements intact.     Conjunctiva/sclera: Conjunctivae normal.     Pupils: Pupils are equal, round, and reactive to light.  Cardiovascular:     Rate and Rhythm: Normal rate and regular rhythm.     Pulses: Normal pulses.     Heart sounds: Normal heart sounds.  Pulmonary:     Effort: Pulmonary effort is normal.     Breath sounds: Normal breath sounds.  Abdominal:     General: Bowel sounds are normal. There is no distension.     Palpations: Abdomen is soft.     Tenderness: There is no abdominal tenderness.  Musculoskeletal:        General: Normal range of motion.     Cervical back: Normal range of motion and neck supple. No tenderness.     Right lower leg: No edema.     Left lower leg: No edema.  Lymphadenopathy:     Cervical: No cervical adenopathy.  Skin:    General: Skin is warm and dry.     Capillary Refill: Capillary refill takes less than 2 seconds.  Neurological:     General: No focal deficit present.     Mental Status: He is alert and oriented to person, place, and time.  Psychiatric:        Mood and Affect: Mood normal.        Behavior: Behavior normal.      ASSESSMENT & PLAN: Victor NajjarLarry was seen today for annual exam.  Diagnoses and all orders for this visit:  Routine general medical examination at a health care  facility  Statin myopathy  Coronary artery disease involving native coronary artery of native heart without angina pectoris  Dyslipidemia  Screening for deficiency anemia -     CBC with Differential/Platelet  Screening for lipoid disorders -     Lipid panel  Screening for endocrine, metabolic and immunity disorder -     Comprehensive metabolic panel -     Hemoglobin A1c  Intractable right heel pain -     Ambulatory referral to Podiatry  Modifiable risk factors discussed with patient. Anticipatory guidance according to age provided. The following topics were discussed: Smoking Diet and nutrition Benefits of exercise  Cancer screening and colonoscopy at age 48.  Cancer family history reviewed Vaccinations.  Fully vaccinated against COVID with a booster Cardiovascular risk assessment Mental health including depression and anxiety Fall and accident prevention Chronic constipation and need to increase fiber intake Need for podiatry referral for injury to right foot Pain to right arm related to dog walking and right arm strain   Patient Instructions     Health Maintenance, Male Adopting a healthy lifestyle and getting preventive care are important in promoting health and wellness. Ask your health care provider about:  The right schedule for you to have regular tests and exams.  Things you can do on your own to prevent diseases and keep yourself healthy. What should I know about diet, weight, and exercise? Eat a healthy diet  Eat a diet that includes plenty of vegetables, fruits, low-fat dairy products, and lean protein.  Do not eat a lot of foods that are high in solid fats, added sugars, or sodium.   Maintain a healthy weight Body mass index (BMI) is a measurement that can be used to identify possible weight problems. It estimates body fat based on height and weight. Your health care provider can help determine your BMI and help you achieve or maintain a healthy  weight. Get regular exercise Get regular exercise. This is one of the most important things you can do for your health. Most adults should:  Exercise for at least 150 minutes each week. The exercise should increase your heart rate and make you sweat (moderate-intensity exercise).  Do strengthening exercises at least twice a week. This is in addition to the moderate-intensity exercise.  Spend less time sitting. Even light physical activity can be beneficial. Watch cholesterol and blood lipids Have your blood tested for lipids and cholesterol at 43 years of age, then have this test every 5 years. You may need to have your cholesterol levels checked more often if:  Your lipid or cholesterol levels are high.  You are older than 43 years of age.  You are at high risk for heart disease. What should I know about cancer screening? Many types of cancers can be detected early and may often be prevented. Depending on your health history and family history, you may need to have cancer screening at various ages. This may include screening for:  Colorectal cancer.  Prostate cancer.  Skin cancer.  Lung cancer. What should I know about heart disease, diabetes, and high blood pressure? Blood pressure and heart disease  High blood pressure causes heart disease and increases the risk of stroke. This is more likely to develop in people who have high blood pressure readings, are of African descent, or are overweight.  Talk with your health care provider about your target blood pressure readings.  Have your blood pressure checked: ? Every 3-5 years if you are 31-37 years of age. ? Every year if you are 27 years old or older.  If you are between the ages of 27 and 12 and are a current or former smoker, ask your health care provider if you should have a one-time screening for abdominal aortic aneurysm (AAA). Diabetes Have regular diabetes screenings. This checks your fasting blood sugar level. Have  the screening done:  Once every three years after age 78 if you are at a normal weight and have a low risk for diabetes.  More often and at a younger age if you are overweight or have a high risk for diabetes. What should I know  about preventing infection? Hepatitis B If you have a higher risk for hepatitis B, you should be screened for this virus. Talk with your health care provider to find out if you are at risk for hepatitis B infection. Hepatitis C Blood testing is recommended for:  Everyone born from [birth year] through 1963.  Anyone with known risk factors for hepatitis C. Sexually transmitted infections (STIs)  You should be screened each year for STIs, including gonorrhea and chlamydia, if: ? You are sexually active and are younger than 42 years of age. ? You are older than 44 years of age and your health care provider tells you that you are at risk for this type of infection. ? Your sexual activity has changed since you were last screened, and you are at increased risk for chlamydia or gonorrhea. Ask your health care provider if you are at risk.  Ask your health care provider about whether you are at high risk for HIV. Your health care provider may recommend a prescription medicine to help prevent HIV infection. If you choose to take medicine to prevent HIV, you should first get tested for HIV. You should then be tested every 3 months for as long as you are taking the medicine. Follow these instructions at home: Lifestyle  Do not use any products that contain nicotine or tobacco, such as cigarettes, e-cigarettes, and chewing tobacco. If you need help quitting, ask your health care provider.  Do not use street drugs.  Do not share needles.  Ask your health care provider for help if you need support or information about quitting drugs. Alcohol use  Do not drink alcohol if your health care provider tells you not to drink.  If you drink alcohol: ? Limit how much you have to 0-2  drinks a day. ? Be aware of how much alcohol is in your drink. In the [place] one drink equals one 12 oz bottle of beer (355 mL), one 5 oz glass of wine (148 mL), or one 1 oz glass of hard liquor (44 mL). General instructions  Schedule regular health, dental, and eye exams.  Stay current with your vaccines.  Tell your health care provider if: ? You often feel depressed. ? You have ever been abused or do not feel safe at home. Summary  Adopting a healthy lifestyle and getting preventive care are important in promoting health and wellness.  Follow your health care provider's instructions about healthy diet, exercising, and getting tested or screened for diseases.  Follow your health care provider's instructions on monitoring your cholesterol and blood pressure. This information is not intended to replace advice given to you by your health care provider. Make sure you discuss any questions you have with your health care provider. Document Revised: 07/17/2018 Document Reviewed: 07/17/2018 Elsevier Patient Education  2021 [employer]        Edwina Barth, MD [facility]

## 2020-12-14 NOTE — Patient Instructions (Signed)

## 2020-12-16 ENCOUNTER — Other Ambulatory Visit: Payer: Self-pay

## 2020-12-16 ENCOUNTER — Ambulatory Visit: Payer: BC Managed Care – PPO | Admitting: Podiatry

## 2020-12-16 ENCOUNTER — Other Ambulatory Visit: Payer: Self-pay | Admitting: Podiatry

## 2020-12-16 ENCOUNTER — Ambulatory Visit (INDEPENDENT_AMBULATORY_CARE_PROVIDER_SITE_OTHER): Payer: BC Managed Care – PPO

## 2020-12-16 DIAGNOSIS — M79671 Pain in right foot: Secondary | ICD-10-CM

## 2020-12-16 DIAGNOSIS — S93691A Other sprain of right foot, initial encounter: Secondary | ICD-10-CM | POA: Diagnosis not present

## 2020-12-16 DIAGNOSIS — M722 Plantar fascial fibromatosis: Secondary | ICD-10-CM | POA: Diagnosis not present

## 2020-12-16 DIAGNOSIS — M7731 Calcaneal spur, right foot: Secondary | ICD-10-CM

## 2020-12-16 MED ORDER — METHYLPREDNISOLONE 4 MG PO TBPK: ORAL_TABLET | ORAL | 0 refills | Status: DC

## 2020-12-16 MED ORDER — MELOXICAM 15 MG PO TABS: 15.0000 mg | ORAL_TABLET | Freq: Every day | ORAL | 0 refills | Status: DC

## 2020-12-16 NOTE — Patient Instructions (Addendum)
Start with the medrol dose pack. Once complete then you can start the mobic  ---  I have ordered an MRI of the right ankle. If you do not hear for them about scheduling within the next 1 week, or you have any questions please give [place] a call at [phone].   ---   Walking Boot, Adult  A walking boot holds your foot or ankle in place after an injury or a medical procedure. This helps with healing and prevents further injury. It has a hard, rigid outer frame that limits movement and supports your foot and leg. The inner lining is a layer of padded material. Walking boots also have adjustable straps to secure them over the foot and leg. A walking boot may be prescribed if you can put weight (bear weight) on your injured foot. How much you can walk while wearing the boot will depend on the type and severity of your injury. How to put on a walking boot There are different types of walking boots. Each type has specific instructions about how to wear it properly. Follow instructions from your health care provider, such as:  Ask someone to help you put on the boot, if needed.  Sit to put on your boot. Doing this is more comfortable and helps to prevent falls.  Open up the boot fully. Place your foot into the boot so your heel rests against the back.  Your toes should be supported by the base of the boot. They should not hang over the front edge.  Adjust the straps so the boot fits securely but is not too tight.  Do not bend the hard frame of the boot to get a good fit. How to walk with a walking boot How much you can walk will depend on your injury. Some tips for managing with a boot include:  Do not try to walk without wearing the boot unless your health care provider approves.  Use other assistive walking devices, including crutches or canes, as told by your health care provider.  On your uninjured foot, wear a shoe with a heel that is close to the height of the walking boot.  Be  careful when walking on surfaces that are uneven or wet. How to reduce swelling while using a walking boot  Rest your injured foot or leg as much as possible.  If directed, put ice on the injured area. To do this: ? Put ice in a plastic bag. ? Place a towel between your skin and the bag. ? Leave the ice on for 20 minutes, 2-3 times a day. ? Remove the ice if your skin turns bright red. This is very important. If you cannot feel pain, heat, or cold, you have a greater risk of damage to the area.  Keep your injured foot or leg raised (elevated) above the level of your heart for at least 2?3 hours each day or as told by your health care provider.  If swelling gets worse, loosen the boot. Rest and elevate your foot and leg.   How to care for your skin and foot while using a walking boot  Wear a long sock to protect your foot and leg from rubbing inside the boot.  Take off the boot one time each day to check the injured area. Check your foot, the surrounding skin, and your leg to make sure there are no sores, rashes, swelling, or wounds. The skin should be a healthy color, not pale or blue.  Try  to notice if your walking pattern (gait) in the boot is fairly normal and that you are walking without a noticeable limp.  Follow instructions from your health care provider about taking care of your incision or wound, if this applies.  Clean and wash the injured area as told by your health care provider.  Gently dry your foot and leg before putting the boot back on. Removing your walking boot Follow directions from your health care provider for removing the walking boot. Generally, it is okay to remove your walking boot:  When you are resting or sleeping.  To clean your foot and leg. How to keep the walking boot clean  Do not put any part of the boot in a washing machine or dryer.  Do not use chemical cleaning products. These could irritate your skin, especially if you have a wound or an  incision.  Do not soak the liner of the boot.  Use a washcloth with mild soap and water to clean the frame and the liner of the boot by hand.  Allow the boot to air-dry completely before you put it back on your foot. Follow these instructions at home: Activity Your activity will be restricted depending on the type and severity of your injury. Follow instructions from your health care provider. Also:  Bathe and shower as told by your health care provider.  Do not do any activities that could make your injury worse.  Do not drive if your affected foot is the one that you use for driving. Contact a health care provider if:  The boot is cracked or damaged.  The boot does not fit properly.  Your foot or leg hurts.  You have a rash, sore, or open sore (ulcer) on your foot or leg.  The skin on your foot or leg is pale.  You have a wound or incision on the foot and it is getting worse.  Your skin becomes painful, red, or irritated.  Your swelling does not get better or it gets worse. Get help right away if:  You have numbness in your foot or leg.  The skin on your foot or leg is cold, blue, or gray. Summary  A walking boot holds your foot or ankle in place after an injury or a medical procedure.  There are different types of walking boots. Follow instructions about how to correctly wear your boot.  Ask someone to help you put on the boot, if needed.  It is important to check your skin and foot every day. Call your health care provider if you notice a rash or sore on your foot or leg. This information is not intended to replace advice given to you by your health care provider. Make sure you discuss any questions you have with your health care provider. Document Revised: 05/17/2020 Document Reviewed: 05/17/2020 Elsevier Patient Education  2021 [employer].

## 2020-12-17 ENCOUNTER — Other Ambulatory Visit: Payer: Self-pay

## 2020-12-17 DIAGNOSIS — E782 Mixed hyperlipidemia: Secondary | ICD-10-CM

## 2020-12-17 MED ORDER — EZETIMIBE 10 MG PO TABS: 10.0000 mg | ORAL_TABLET | Freq: Every day | ORAL | 3 refills | Status: DC

## 2020-12-17 NOTE — Telephone Encounter (Signed)
Refilled medication

## 2020-12-21 NOTE — Progress Notes (Signed)
Subjective:   Patient ID: Victor Delgado, male   DOB: 45 y.o.   MRN: [id number]   HPI 45 year old male presents the office today for concerns of right heel pain.  He states that he was having some pain prior about 1 month ago he felt a pop in the bottom of his foot with increased pain.  This happened again about 1 week ago when he felt a pop while playing sports.  He states that hurt for couple days and started to feel better.  He has had heel pain now for about 1 year without any significant treatment.  No recent injury otherwise.  No other concerns.   Review of Systems  All other systems reviewed and are negative.  Past Medical History:  Diagnosis Date  . CAD (coronary artery disease) 07/26/2016  . Chest pain 03/09/2016  . Essential hypertension 04/26/2016  . Hyperlipidemia 04/26/2016  . Obesity (BMI 30-39.9) 04/26/2016    Past Surgical History:  Procedure Laterality Date  . APPENDECTOMY  2002  . arm surgery Right    per patient right and 20 years ago     Current Outpatient Medications:  .  meloxicam (MOBIC) 15 MG tablet, Take 1 tablet (15 mg total) by mouth daily., Disp: 30 tablet, Rfl: 0 .  methylPREDNISolone (MEDROL DOSEPAK) 4 MG TBPK tablet, Take as directed, Disp: 21 tablet, Rfl: 0 .  Multiple Vitamin (MULTI VITAMIN MENS PO), Take by mouth daily., Disp: , Rfl:  .  predniSONE (DELTASONE) 10 MG tablet, Take by mouth., Disp: , Rfl:  .  rifaximin (XIFAXAN) 550 MG TABS tablet, 1 tab(s), Disp: , Rfl:  .  ezetimibe (ZETIA) 10 MG tablet, Take 1 tablet (10 mg total) by mouth daily., Disp: 90 tablet, Rfl: 3  No Known Allergies        Objective:  Physical Exam  General: AAO x3, NAD  Dermatological: Skin is warm, dry and supple bilateral. There are no open sores, no preulcerative lesions, no rash or signs of infection present.  Vascular: Dorsalis Pedis artery and Posterior Tibial artery pedal pulses are 2/4 bilateral with immedate capillary fill time.  There is no pain with calf  compression, swelling, warmth, erythema.   Neruologic: Grossly intact via light touch bilateral.  Negative Tinel sign.  Musculoskeletal: There is tenderness palpation on the plantar aspect of the calcaneus at the insertion of plantar fascial right side there is localized edema.  Mild discomfort the arch of the foot on the plantar fashion as well.  There is no pain with lateral compression of calcaneus there is no area of pinpoint tenderness.  Flexor, extensor tendons appear intact otherwise.  Achilles tendon without any discomfort and appears to be intact.  Muscular strength 5/5 in all groups tested bilateral.  Gait: Unassisted, Nonantalgic.       Assessment:   45 year old male with right plantar fasciitis, likely partial tearing     Plan:  -Treatment options discussed including all alternatives, risks, and complications -Etiology of symptoms were discussed -X-rays were obtained and reviewed with the patient.  No evidence of acute fracture or stress fracture. -Given his symptoms I recommend mobilization a cam boot and he was unable to do this due to work.  The plantar fascial brace was dispensed.  Prescription of Medrol Dosepak once this is complete can start meloxicam as needed.  Ice daily. -Given concern for rupture ordered MRI.

## 2020-12-27 ENCOUNTER — Telehealth: Payer: Self-pay | Admitting: *Deleted

## 2020-12-27 NOTE — Telephone Encounter (Signed)
Called and spoke with [facility] and the representative stated that they have tried to call the patient on first attempt and they will call again for second attempt and if no answer they will not call again. Misty Stanley

## 2020-12-27 NOTE — Telephone Encounter (Signed)
-----   Message from Vivi Barrack, DPM sent at 12/21/2020  8:04 AM EDT ----- I ordered an MRI last Thursday. Can you please follow up on this? Thanks!

## 2021-01-24 ENCOUNTER — Other Ambulatory Visit: Payer: Self-pay | Admitting: Podiatry

## 2021-01-25 MED ORDER — MELOXICAM 15 MG PO TABS: 15.0000 mg | ORAL_TABLET | Freq: Every day | ORAL | 0 refills | Status: DC

## 2021-02-22 ENCOUNTER — Other Ambulatory Visit: Payer: Self-pay | Admitting: Podiatry

## 2021-03-26 ENCOUNTER — Other Ambulatory Visit: Payer: Self-pay | Admitting: Podiatry

## 2021-05-03 ENCOUNTER — Other Ambulatory Visit: Payer: Self-pay | Admitting: Podiatry

## 2021-06-05 ENCOUNTER — Other Ambulatory Visit: Payer: Self-pay | Admitting: Podiatry

## 2021-07-20 ENCOUNTER — Other Ambulatory Visit: Payer: Self-pay | Admitting: Podiatry

## 2021-08-23 ENCOUNTER — Other Ambulatory Visit: Payer: Self-pay | Admitting: Podiatry

## 2021-09-27 ENCOUNTER — Other Ambulatory Visit: Payer: Self-pay | Admitting: Podiatry

## 2021-11-09 ENCOUNTER — Ambulatory Visit
Admission: EM | Admit: 2021-11-09 | Discharge: 2021-11-09 | Disposition: A | Discharge status: Home / Self Care | Payer: BC Managed Care – PPO | Attending: Emergency Medicine | Admitting: Emergency Medicine

## 2021-11-09 DIAGNOSIS — H9209 Otalgia, unspecified ear: Secondary | ICD-10-CM

## 2021-11-09 DIAGNOSIS — R059 Cough, unspecified: Secondary | ICD-10-CM

## 2021-11-09 MED ORDER — CETIRIZINE HCL 10 MG PO TABS: 10.0000 mg | ORAL_TABLET | Freq: Every day | ORAL | 0 refills | Status: DC

## 2021-11-09 MED ORDER — FLUTICASONE PROPIONATE 50 MCG/ACT NA SUSP
1.0000 | Freq: Every day | NASAL | 2 refills | Status: DC
Start: 2021-11-09 — End: 2023-04-23

## 2021-11-09 MED ORDER — BENZONATATE 100 MG PO CAPS: 100.0000 mg | ORAL_CAPSULE | Freq: Three times a day (TID) | ORAL | 0 refills | Status: DC | PRN

## 2021-11-09 NOTE — ED Provider Notes (Signed)
? ?[facility] ?Provider Note ? ?Patient Contact: 12:34 PM (approximate) ? ? ?History  ? ?Sore Throat ? ? ?HPI ? ?Victor Delgado is a 46 y.o. male presents to the urgent care with cough, nasal congestion and ear pain for the past 3 days.  Patient's wife had similar symptoms approximately 10 days ago.  No chest pain, chest tightness or abdominal pain. ? ?  ? ? ?Physical Exam  ? ?Triage Vital Signs: ?ED Triage Vitals  ?Enc Vitals Group  ?   BP 11/09/21 1219 (!) 144/87  ?   Pulse Rate 11/09/21 1219 73  ?   Resp 11/09/21 1219 18  ?   Temp 11/09/21 1219 98.1 ?F (36.7 ?C)  ?   Temp Source 11/09/21 1219 Oral  ?   SpO2 11/09/21 1219 96 %  ?   Weight --   ?   Height --   ?   Head Circumference --   ?   Peak Flow --   ?   Pain Score 11/09/21 1220 0  ?   Pain Loc --   ?   Pain Edu? --   ?   Excl. in GC? --   ? ? ?Most recent vital signs: ?Vitals:  ? 11/09/21 1219  ?BP: (!) 144/87  ?Pulse: 73  ?Resp: 18  ?Temp: 98.1 ?F (36.7 ?C)  ?SpO2: 96%  ? ? ? ?Constitutional: Alert and oriented. Patient is lying supine. ?Eyes: Conjunctivae are normal. PERRL. EOMI. ?Head: Atraumatic. ?ENT: ?     Ears: Tympanic membranes are mildly injected with mild effusion bilaterally.  ?     Nose: No congestion/rhinnorhea. ?     Mouth/Throat: Mucous membranes are moist. Posterior pharynx is mildly erythematous.  ?Hematological/Lymphatic/Immunilogical: No cervical lymphadenopathy.  ?Cardiovascular: Normal rate, regular rhythm. Normal S1 and S2.  Good peripheral circulation. ?Respiratory: Normal respiratory effort without tachypnea or retractions. Lungs CTAB. Good air entry to the bases with no decreased or absent breath sounds. ?Gastrointestinal: Bowel sounds ?4 quadrants. Soft and nontender to palpation. No guarding or rigidity. No palpable masses. No distention. No CVA tenderness. ?Musculoskeletal: Full range of motion to all extremities. No gross deformities appreciated. ?Neurologic:  Normal speech and language. No gross focal  neurologic deficits are appreciated.  ?Skin:  Skin is warm, dry and intact. No rash noted. ?Psychiatric: Mood and affect are normal. Speech and behavior are normal. Patient exhibits appropriate insight and judgement. ? ? ?ED Results / Procedures / Treatments  ? ?Labs ?(all labs ordered are listed, but only abnormal results are displayed) ?Labs Reviewed - No data to display ? ? ? ? ?PROCEDURES: ? ?Critical Care performed: No ? ?Procedures ? ? ?MEDICATIONS ORDERED IN ED: ?Medications - No data to display ? ? ?IMPRESSION / MDM / ASSESSMENT AND PLAN / ED COURSE  ?I reviewed the triage vital signs and the nursing notes. ?             ?               ?Assessment and plan ?Viral URI ?Differential diagnosis includes, but is not limited to, cough, nasal congestion, rhinorrhea ? ?46 year old male presents to the urgent care for viral URI-like symptoms.  Patient was discharged with Flonase, Zyrtec and Tessalon Perles and advised to follow-up with his primary care provider as needed. ?  ? ? ?FINAL CLINICAL IMPRESSION(S) / ED DIAGNOSES  ? ?Final diagnoses:  ?Cough, unspecified type  ?Otalgia, unspecified laterality  ? ? ? ?Rx / DC Orders  ? ?  ED Discharge Orders   ? ?      Ordered  ?  cetirizine (ZYRTEC ALLERGY) 10 MG tablet  Daily       ? 11/09/21 1233  ?  fluticasone (FLONASE) 50 MCG/ACT nasal spray  Daily       ? 11/09/21 1233  ?  benzonatate (TESSALON PERLES) 100 MG capsule  3 times daily PRN       ? 11/09/21 1233  ? ?  ?  ? ?  ? ? ? ?Note:  This document was prepared using Dragon voice recognition software and may include unintentional dictation errors. ?  ?Orvil Feil, PA-C ?11/09/21 1236 ? ?

## 2021-11-09 NOTE — ED Triage Notes (Signed)
Pt c/o cough, sore throat, nasal drainage, ear pressure, occasional headache,  ? ?Denies nausea, vomiting, diarrhea, constipation, fever, body aches or chills  ? ?Onset ~ 5 days ago  ?

## 2021-11-09 NOTE — Discharge Instructions (Signed)
Take Tessalon Perles, Flonase and Zyrtec as directed. ?

## 2021-11-15 ENCOUNTER — Encounter: Payer: Self-pay | Admitting: Emergency Medicine

## 2021-11-15 ENCOUNTER — Ambulatory Visit (INDEPENDENT_AMBULATORY_CARE_PROVIDER_SITE_OTHER): Payer: BC Managed Care – PPO | Admitting: Emergency Medicine

## 2021-11-15 VITALS — BP 144/80 | HR 75 | Temp 97.7°F | Ht 73.0 in | Wt 285.0 lb

## 2021-11-15 DIAGNOSIS — J988 Other specified respiratory disorders: Secondary | ICD-10-CM

## 2021-11-15 DIAGNOSIS — B9789 Other viral agents as the cause of diseases classified elsewhere: Secondary | ICD-10-CM

## 2021-11-15 DIAGNOSIS — R051 Acute cough: Secondary | ICD-10-CM

## 2021-11-15 MED ORDER — HYDROCODONE BIT-HOMATROP MBR 5-1.5 MG/5ML PO SOLN
5.0000 mL | Freq: Four times a day (QID) | ORAL | 0 refills | Status: DC | PRN
Start: 2021-11-15 — End: 2022-09-10

## 2021-11-15 MED ORDER — PREDNISONE 20 MG PO TABS
20.0000 mg | ORAL_TABLET | Freq: Every day | ORAL | 0 refills | Status: AC
Start: 2021-11-15 — End: 2021-11-20

## 2021-11-15 MED ORDER — BENZONATATE 200 MG PO CAPS
200.0000 mg | ORAL_CAPSULE | Freq: Two times a day (BID) | ORAL | 0 refills | Status: DC | PRN
Start: 2021-11-15 — End: 2022-09-18

## 2021-11-15 NOTE — Patient Instructions (Signed)

## 2021-11-15 NOTE — Progress Notes (Signed)
Victor Delgado ?46 y.o. ? ? ?Chief Complaint  ?Patient presents with  ? Cough  ?  Sx include sore throat and nasal drainage. Has been going on for over a week and is effecting his sleep  ? ? ?HISTORY OF PRESENT ILLNESS: ?Acute problem visit today. ?This is a 46 y.o. male complaining of 1 week history of flulike symptoms that started with sore throat followed by dry cough, general malaise, congestion.  Slowly getting better but persistent cough.  No other associated symptoms. ?No other complaints or medical concerns today. ? ?Cough ?Pertinent negatives include no chest pain, chills, fever, headaches, rash or shortness of breath.  ? ? ?Prior to Admission medications   ?Medication Sig Start Date End Date Taking? Authorizing Provider  ?cetirizine (ZYRTEC ALLERGY) 10 MG tablet Take 1 tablet (10 mg total) by mouth daily for 7 days. 11/09/21 11/16/21 Yes Vallarie Mare M, PA-C  ?ezetimibe (ZETIA) 10 MG tablet Take 1 tablet (10 mg total) by mouth daily. 12/17/20  Yes Steph Cheadle, Ines Bloomer, MD  ?fluticasone [facility]) 50 MCG/ACT nasal spray Place 1 spray into both nostrils daily for 7 days. 11/09/21 11/16/21 Yes Vallarie Mare M, PA-C  ?meloxicam (MOBIC) 15 MG tablet TAKE 1 TABLET (15 MG TOTAL) BY MOUTH DAILY. 09/27/21 09/27/22 Yes Trula Slade, DPM  ?methylPREDNISolone (MEDROL DOSEPAK) 4 MG TBPK tablet Take as directed 12/16/20  Yes Trula Slade, DPM  ?Multiple Vitamin (MULTI VITAMIN MENS PO) Take by mouth daily.   Yes [provider]  ?predniSONE (DELTASONE) 10 MG tablet Take by mouth. 09/28/20  Yes [provider]  ?rifaximin (XIFAXAN) 550 MG TABS tablet 1 tab(s) 02/03/09  Yes [provider]  ?benzonatate (TESSALON PERLES) 100 MG capsule Take 1 capsule (100 mg total) by mouth 3 (three) times daily as needed for up to 7 days for cough. ?Patient not taking: Reported on 11/15/2021 11/09/21 11/16/21  Lannie Fields, PA-C  ? ? ?No Known Allergies ? ?Patient Active Problem List  ? Diagnosis Date Noted  ?  Chronic constipation 05/03/2018  ? CAD (coronary artery disease) 07/26/2016  ? Essential hypertension 04/26/2016  ? Hyperlipidemia 04/26/2016  ? Obesity (BMI 30-39.9) 04/26/2016  ? ? ?Past Medical History:  ?Diagnosis Date  ? CAD (coronary artery disease) 07/26/2016  ? Chest pain 03/09/2016  ? Essential hypertension 04/26/2016  ? Hyperlipidemia 04/26/2016  ? Obesity (BMI 30-39.9) 04/26/2016  ? ? ?Past Surgical History:  ?Procedure Laterality Date  ? APPENDECTOMY  2002  ? arm surgery Right   ? per patient right and 20 years ago  ? ? ?Social History  ? ?Socioeconomic History  ? Marital status: Married  ?  Spouse name: Not on file  ? Number of children: Not on file  ? Years of education: Not on file  ? Highest education level: Not on file  ?Occupational History  ? Not on file  ?Tobacco Use  ? Smoking status: Never  ? Smokeless tobacco: Never  ?Substance and Sexual Activity  ? Alcohol use: No  ?  Alcohol/week: 0.0 standard drinks  ? Drug use: No  ? Sexual activity: Yes  ?Other Topics Concern  ? Not on file  ?Social History Narrative  ? Not on file  ? ?Social Determinants of Health  ? ?Financial Resource Strain: Not on file  ?Food Insecurity: Not on file  ?Transportation Needs: Not on file  ?Physical Activity: Not on file  ?Stress: Not on file  ?Social Connections: Not on file  ?Intimate Partner Violence: Not on  file  ? ? ?Family History  ?Problem Relation Age of Onset  ? Hypertension Mother   ? Diabetes Mother   ? Heart attack Paternal Uncle   ? Stroke Maternal Grandmother   ? Stroke Maternal Grandfather   ? Heart attack Paternal Grandfather   ? Esophageal cancer Neg Hx   ? Pancreatic cancer Neg Hx   ? Stomach cancer Neg Hx   ? Liver disease Neg Hx   ? Colon cancer Neg Hx   ? ? ? ?Review of Systems  ?Constitutional:  Positive for malaise/fatigue. Negative for chills and fever.  ?HENT:  Positive for congestion.   ?Respiratory:  Positive for cough. Negative for sputum production and shortness of breath.   ?Cardiovascular:  Negative.  Negative for chest pain and palpitations.  ?Gastrointestinal: Negative.  Negative for abdominal pain, diarrhea, nausea and vomiting.  ?Genitourinary: Negative.  Negative for dysuria.  ?Skin: Negative.  Negative for rash.  ?Neurological: Negative.  Negative for dizziness and headaches.  ?All other systems reviewed and are negative. ? ?Today's Vitals  ? 11/15/21 1354  ?BP: (!) 144/80  ?Pulse: 75  ?Temp: 97.7 ?F (36.5 ?C)  ?TempSrc: Temporal  ?SpO2: 98%  ?Weight: 285 lb (129.3 kg)  ?Height: 6\' 1"  (1.854 m)  ? ?Body mass index is 37.6 kg/m?. ? ?Physical Exam ?Vitals reviewed.  ?Constitutional:   ?   Appearance: Normal appearance.  ?HENT:  ?   Head: Normocephalic.  ?   Right Ear: Tympanic membrane, ear canal and external ear normal.  ?   Left Ear: Tympanic membrane, ear canal and external ear normal.  ?   Mouth/Throat:  ?   Mouth: Mucous membranes are moist.  ?   Pharynx: Oropharynx is clear.  ?Eyes:  ?   Extraocular Movements: Extraocular movements intact.  ?   Conjunctiva/sclera: Conjunctivae normal.  ?   Pupils: Pupils are equal, round, and reactive to light.  ?Cardiovascular:  ?   Rate and Rhythm: Normal rate and regular rhythm.  ?   Pulses: Normal pulses.  ?   Heart sounds: Normal heart sounds.  ?Pulmonary:  ?   Effort: Pulmonary effort is normal.  ?   Breath sounds: Normal breath sounds.  ?Musculoskeletal:     ?   General: Normal range of motion.  ?   Cervical back: No tenderness.  ?Lymphadenopathy:  ?   Cervical: No cervical adenopathy.  ?Skin: ?   General: Skin is warm and dry.  ?   Capillary Refill: Capillary refill takes less than 2 seconds.  ?Neurological:  ?   General: No focal deficit present.  ?   Mental Status: He is alert and oriented to person, place, and time.  ?Psychiatric:     ?   Mood and Affect: Mood normal.     ?   Behavior: Behavior normal.  ? ? ? ?ASSESSMENT & PLAN: ?Clinically stable.  Viral respiratory infection running its course but still has persistent cough. ?We will start  prednisone 20 mg daily for 5 days along with cough medication, Tessalon and Hycodan syrup. ?Advised to rest and stay well-hydrated. ?Follow-up in the office if no better or worse during the next several days. ? ?Problem List Items Addressed This Visit   ?None ?Visit Diagnoses   ? ? Acute cough    -  Primary  ? Relevant Medications  ? predniSONE (DELTASONE) 20 MG tablet  ? benzonatate (TESSALON) 200 MG capsule  ? HYDROcodone bit-homatropine (HYCODAN) 5-1.5 MG/5ML syrup  ? Viral respiratory infection      ? ?  ? ?  Patient Instructions  ?Cough, Adult ?A cough helps to clear your throat and lungs. A cough may be a sign of an illness or another medical condition. ?An acute cough may only last 2-3 weeks, while a chronic cough may last 8 or more weeks. ?Many things can cause a cough. They include: ?Germs (viruses or bacteria) that attack the airway. ?Breathing in things that bother (irritate) your lungs. ?Allergies. ?Asthma. ?Mucus that runs down the back of your throat (postnasal drip). ?Smoking. ?Acid backing up from the stomach into the tube that moves food from the mouth to the stomach (gastroesophageal reflux). ?Some medicines. ?Lung problems. ?Other medical conditions, such as heart failure or a blood clot in the lung (pulmonary embolism). ?Follow these instructions at home: ?Medicines ?Take over-the-counter and prescription medicines only as told by your doctor. ?Talk with your doctor before you take medicines that stop a cough (cough suppressants). ?Lifestyle ? ?Do not smoke, and try not to be around smoke. Do not use any products that contain nicotine or tobacco, such as cigarettes, e-cigarettes, and chewing tobacco. If you need help quitting, ask your doctor. ?Drink enough fluid to keep your pee (urine) pale yellow. ?Avoid caffeine. ?Do not drink alcohol if your doctor tells you not to drink. ?General instructions ? ?Watch for any changes in your cough. Tell your doctor about them. ?Always cover your mouth when  you cough. ?Stay away from things that make you cough, such as perfume, candles, campfire smoke, or cleaning products. ?If the air is dry, use a cool mist vaporizer or humidifier in your home. ?If your cough is wor

## 2021-12-15 ENCOUNTER — Other Ambulatory Visit: Payer: Self-pay | Admitting: Emergency Medicine

## 2021-12-15 DIAGNOSIS — E782 Mixed hyperlipidemia: Secondary | ICD-10-CM

## 2021-12-19 ENCOUNTER — Encounter: Payer: BC Managed Care – PPO | Admitting: Emergency Medicine

## 2022-01-16 ENCOUNTER — Encounter: Payer: BC Managed Care – PPO | Admitting: Emergency Medicine

## 2022-01-18 ENCOUNTER — Ambulatory Visit (INDEPENDENT_AMBULATORY_CARE_PROVIDER_SITE_OTHER): Payer: BC Managed Care – PPO | Admitting: Emergency Medicine

## 2022-01-18 ENCOUNTER — Encounter: Payer: Self-pay | Admitting: Emergency Medicine

## 2022-01-18 VITALS — BP 136/80 | HR 70 | Temp 98.0°F | Ht 74.0 in | Wt 283.5 lb

## 2022-01-18 DIAGNOSIS — Z0000 Encounter for general adult medical examination without abnormal findings: Secondary | ICD-10-CM

## 2022-01-18 DIAGNOSIS — Z13228 Encounter for screening for other metabolic disorders: Secondary | ICD-10-CM | POA: Diagnosis not present

## 2022-01-18 DIAGNOSIS — Z13220 Encounter for screening for lipoid disorders: Secondary | ICD-10-CM

## 2022-01-18 DIAGNOSIS — E785 Hyperlipidemia, unspecified: Secondary | ICD-10-CM | POA: Diagnosis not present

## 2022-01-18 DIAGNOSIS — I10 Essential (primary) hypertension: Secondary | ICD-10-CM

## 2022-01-18 DIAGNOSIS — Z1329 Encounter for screening for other suspected endocrine disorder: Secondary | ICD-10-CM

## 2022-01-18 DIAGNOSIS — Z125 Encounter for screening for malignant neoplasm of prostate: Secondary | ICD-10-CM | POA: Diagnosis not present

## 2022-01-18 DIAGNOSIS — Z130 Encounter for screening for diseases of the blood and blood-forming organs and certain disorders involving the immune mechanism: Secondary | ICD-10-CM

## 2022-01-18 DIAGNOSIS — Z1159 Encounter for screening for other viral diseases: Secondary | ICD-10-CM

## 2022-01-18 LAB — CBC WITH DIFFERENTIAL/PLATELET
Basophils Absolute: 0 10*3/uL (ref 0.0–0.1)
Basophils Relative: 0.5 % (ref 0.0–3.0)
Eosinophils Absolute: 0.1 10*3/uL (ref 0.0–0.7)
Eosinophils Relative: 1.9 % (ref 0.0–5.0)
HCT: 46.1 % (ref 39.0–52.0)
Hemoglobin: 15.6 g/dL (ref 13.0–17.0)
Lymphocytes Relative: 23.1 % (ref 12.0–46.0)
Lymphs Abs: 1.8 10*3/uL (ref 0.7–4.0)
MCHC: 33.9 g/dL (ref 30.0–36.0)
MCV: 92.1 fl (ref 78.0–100.0)
Monocytes Absolute: 0.4 10*3/uL (ref 0.1–1.0)
Monocytes Relative: 5.5 % (ref 3.0–12.0)
Neutro Abs: 5.3 10*3/uL (ref 1.4–7.7)
Neutrophils Relative %: 69 % (ref 43.0–77.0)
Platelets: 256 10*3/uL (ref 150.0–400.0)
RBC: 5.01 Mil/uL (ref 4.22–5.81)
RDW: 13.1 % (ref 11.5–15.5)
WBC: 7.7 10*3/uL (ref 4.0–10.5)

## 2022-01-18 LAB — LIPID PANEL
Cholesterol: 256 mg/dL — ABNORMAL HIGH (ref 0–200)
HDL: 44.1 mg/dL (ref >39.00)
NonHDL: 212.33
Total CHOL/HDL Ratio: 6
Triglycerides: 225 mg/dL — ABNORMAL HIGH (ref 0.0–149.0)
VLDL: 45 mg/dL — ABNORMAL HIGH (ref 0.0–40.0)

## 2022-01-18 LAB — COMPREHENSIVE METABOLIC PANEL
ALT: 36 U/L (ref 0–53)
AST: 22 U/L (ref 0–37)
Albumin: 4.6 g/dL (ref 3.5–5.2)
Alkaline Phosphatase: 66 U/L (ref 39–117)
BUN: 14 mg/dL (ref 6–23)
CO2: 27 mEq/L (ref 19–32)
Calcium: 9.6 mg/dL (ref 8.4–10.5)
Chloride: 104 mEq/L (ref 96–112)
Creatinine, Ser: 0.88 mg/dL (ref 0.40–1.50)
GFR: 104.74 mL/min (ref >60.00)
Glucose, Bld: 95 mg/dL (ref 70–99)
Potassium: 4.1 mEq/L (ref 3.5–5.1)
Sodium: 139 mEq/L (ref 135–145)
Total Bilirubin: 0.4 mg/dL (ref 0.2–1.2)
Total Protein: 7.2 g/dL (ref 6.0–8.3)

## 2022-01-18 LAB — LDL CHOLESTEROL, DIRECT: Direct LDL: 177 mg/dL

## 2022-01-18 LAB — HEMOGLOBIN A1C: Hgb A1c MFr Bld: 5.4 % (ref 4.6–6.5)

## 2022-01-18 LAB — PSA: PSA: 0.46 ng/mL (ref 0.10–4.00)

## 2022-01-18 MED ORDER — MELOXICAM 15 MG PO TABS: 15.0000 mg | ORAL_TABLET | Freq: Every day | ORAL | 3 refills | Status: AC

## 2022-01-18 NOTE — Progress Notes (Signed)
Victor Delgado 46 y.o.   Chief Complaint  Patient presents with   Annual Exam    No concerns    HISTORY OF PRESENT ILLNESS: This is a 46 y.o. male here for annual exam. No complaints or medical concerns. History of hypertension and dyslipidemia but on no medications at present time. 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  HPI   Prior to Admission medications   Medication Sig Start Date End Date Taking? Authorizing Provider  ezetimibe (ZETIA) 10 MG tablet TAKE 1 TABLET BY MOUTH EVERY DAY 12/15/21  Yes Andee Chivers, Eilleen Kempf, MD  meloxicam (MOBIC) 15 MG tablet TAKE 1 TABLET (15 MG TOTAL) BY MOUTH DAILY. 09/27/21 09/27/22 Yes Vivi Barrack, DPM  benzonatate (TESSALON) 200 MG capsule Take 1 capsule (200 mg total) by mouth 2 (two) times daily as needed for cough. Patient not taking: Reported on 01/18/2022 11/15/21   Georgina Quint, MD  cetirizine (ZYRTEC ALLERGY) 10 MG tablet Take 1 tablet (10 mg total) by mouth daily for 7 days. 11/09/21 11/16/21  Orvil Feil, PA-C  fluticasone (FLONASE) 50 MCG/ACT nasal spray Place 1 spray into both nostrils daily for 7 days. 11/09/21 11/16/21  Orvil Feil, PA-C  HYDROcodone bit-homatropine (HYCODAN) 5-1.5 MG/5ML syrup Take 5 mLs by mouth every 6 (six) hours as needed for cough. Patient not taking: Reported on 01/18/2022 11/15/21   Georgina Quint, MD  Multiple Vitamin [facility] VITAMIN MENS PO) Take by mouth daily. Patient not taking: Reported on 01/18/2022    [provider]  rifaximin (XIFAXAN) 550 MG TABS tablet 1 tab(s) Patient not taking: Reported on 01/18/2022 02/03/09   [provider]    No Known Allergies  Patient Active Problem List   Diagnosis Date Noted   Chronic constipation 05/03/2018   CAD (coronary artery disease) 07/26/2016   Essential hypertension 04/26/2016   Hyperlipidemia 04/26/2016   Obesity (BMI 30-39.9) 04/26/2016    Past Medical History:  Diagnosis Date   CAD (coronary artery  disease) 07/26/2016   Chest pain 03/09/2016   Essential hypertension 04/26/2016   Hyperlipidemia 04/26/2016   Obesity (BMI 30-39.9) 04/26/2016    Past Surgical History:  Procedure Laterality Date   APPENDECTOMY  2002   arm surgery Right    per patient right and 20 years ago    Social History   Socioeconomic History   Marital status: Married    Spouse name: Not on file   Number of children: Not on file   Years of education: Not on file   Highest education level: Not on file  Occupational History   Not on file  Tobacco Use   Smoking status: Never   Smokeless tobacco: Never  Substance and Sexual Activity   Alcohol use: No    Alcohol/week: 0.0 standard drinks of alcohol   Drug use: No   Sexual activity: Yes  Other Topics Concern   Not on file  Social History Narrative   Not on file   Social Determinants of Health   Financial Resource Strain: Not on file  Food Insecurity: Not on file  Transportation Needs: Not on file  Physical Activity: Not on file  Stress: Not on file  Social Connections: Not on file  Intimate Partner Violence: Not on file    Family History  Problem Relation Age of Onset   Hypertension Mother    Diabetes Mother    Heart attack Paternal Uncle    Stroke Maternal Grandmother    Stroke Maternal Grandfather  Heart attack Paternal Grandfather    Esophageal cancer Neg Hx    Pancreatic cancer Neg Hx    Stomach cancer Neg Hx    Liver disease Neg Hx    Colon cancer Neg Hx      Review of Systems  Constitutional: Negative.  Negative for chills and fever.  HENT: Negative.  Negative for congestion and sore throat.   Eyes: Negative.   Respiratory: Negative.  Negative for cough and shortness of breath.   Cardiovascular: Negative.  Negative for chest pain and palpitations.  Gastrointestinal:  Negative for abdominal pain, diarrhea, nausea and vomiting.  Genitourinary: Negative.   Musculoskeletal: Negative.   Skin: Negative.  Negative for rash.   Neurological: Negative.  Negative for dizziness and headaches.  All other systems reviewed and are negative.  Today's Vitals   01/18/22 1552  BP: 136/80  Pulse: 70  Temp: 98 F (36.7 C)  TempSrc: Oral  SpO2: 96%  Weight: 283 lb 8 oz (128.6 kg)  Height: 6\' 2"  (1.88 m)   Body mass index is 36.4 kg/m. BP Readings from Last 3 Encounters:  01/18/22 136/80  11/15/21 (!) 144/80  11/09/21 (!) 144/87   Wt Readings from Last 3 Encounters:  01/18/22 283 lb 8 oz (128.6 kg)  11/15/21 285 lb (129.3 kg)  12/14/20 285 lb (129.3 kg)     Physical Exam Vitals reviewed.  Constitutional:      Appearance: Normal appearance.  HENT:     Head: Normocephalic.     Right Ear: Tympanic membrane, ear canal and external ear normal.     Left Ear: Tympanic membrane, ear canal and external ear normal.     Mouth/Throat:     Mouth: Mucous membranes are moist.     Pharynx: Oropharynx is clear.  Eyes:     Extraocular Movements: Extraocular movements intact.     Conjunctiva/sclera: Conjunctivae normal.     Pupils: Pupils are equal, round, and reactive to light.  Cardiovascular:     Rate and Rhythm: Normal rate and regular rhythm.     Pulses: Normal pulses.     Heart sounds: Normal heart sounds.  Pulmonary:     Effort: Pulmonary effort is normal.     Breath sounds: Normal breath sounds.  Abdominal:     General: There is no distension.     Palpations: Abdomen is soft.     Tenderness: There is no abdominal tenderness.  Musculoskeletal:     Cervical back: No tenderness.     Right lower leg: No edema.     Left lower leg: No edema.  Lymphadenopathy:     Cervical: No cervical adenopathy.  Skin:    General: Skin is warm and dry.     Capillary Refill: Capillary refill takes less than 2 seconds.  Neurological:     General: No focal deficit present.     Mental Status: He is alert and oriented to person, place, and time.  Psychiatric:        Mood and Affect: Mood normal.        Behavior: Behavior  normal.      ASSESSMENT & PLAN: Problem List Items Addressed This Visit       Cardiovascular and Mediastinum   Essential hypertension   Relevant Orders   Comprehensive metabolic panel     Other   Hyperlipidemia   Relevant Orders   Lipid panel   Other Visit Diagnoses     Routine general medical examination at a health care facility    -  Primary   Prostate cancer screening       Relevant Orders   PSA(Must document that pt has been informed of limitations of PSA testing.)   Need for hepatitis C screening test       Relevant Orders   Hepatitis C antibody screen   Screening for deficiency anemia       Relevant Orders   CBC with Differential   Screening for lipoid disorders       Relevant Orders   Lipid panel   Screening for endocrine, metabolic and immunity disorder       Relevant Orders   Comprehensive metabolic panel   Hemoglobin A1c      Modifiable risk factors discussed with patient. Anticipatory guidance according to age provided. The following topics were also discussed: Social Determinants of Health Smoking.  Non-smoker Diet and nutrition and need to decrease amount of daily carbohydrate intake and daily calories Benefits of exercise Cancer family history review Vaccinations recommendations Cardiovascular risk assessment and need for blood work today Diagnosis and treatment of hypertension and dyslipidemia Mental health including depression and anxiety Fall and accident prevention  Patient Instructions  Health Maintenance, Male Adopting a healthy lifestyle and getting preventive care are important in promoting health and wellness. Ask your health care provider about: The right schedule for you to have regular tests and exams. Things you can do on your own to prevent diseases and keep yourself healthy. What should I know about diet, weight, and exercise? Eat a healthy diet  Eat a diet that includes plenty of vegetables, fruits, low-fat dairy products,  and lean protein. Do not eat a lot of foods that are high in solid fats, added sugars, or sodium. Maintain a healthy weight Body mass index (BMI) is a measurement that can be used to identify possible weight problems. It estimates body fat based on height and weight. Your health care provider can help determine your BMI and help you achieve or maintain a healthy weight. Get regular exercise Get regular exercise. This is one of the most important things you can do for your health. Most adults should: Exercise for at least 150 minutes each week. The exercise should increase your heart rate and make you sweat (moderate-intensity exercise). Do strengthening exercises at least twice a week. This is in addition to the moderate-intensity exercise. Spend less time sitting. Even light physical activity can be beneficial. Watch cholesterol and blood lipids Have your blood tested for lipids and cholesterol at 44 years of age, then have this test every 5 years. You may need to have your cholesterol levels checked more often if: Your lipid or cholesterol levels are high. You are older than 44 years of age. You are at high risk for heart disease. What should I know about cancer screening? Many types of cancers can be detected early and may often be prevented. Depending on your health history and family history, you may need to have cancer screening at various ages. This may include screening for: Colorectal cancer. Prostate cancer. Skin cancer. Lung cancer. What should I know about heart disease, diabetes, and high blood pressure? Blood pressure and heart disease High blood pressure causes heart disease and increases the risk of stroke. This is more likely to develop in people who have high blood pressure readings or are overweight. Talk with your health care provider about your target blood pressure readings. Have your blood pressure checked: Every 3-5 years if you are 57-48 years of age. Every year if  you  are 44 years old or older. If you are between the ages of 8465 and 7275 and are a current or former smoker, ask your health care provider if you should have a one-time screening for abdominal aortic aneurysm (AAA). Diabetes Have regular diabetes screenings. This checks your fasting blood sugar level. Have the screening done: Once every three years after age 44 if you are at a normal weight and have a low risk for diabetes. More often and at a younger age if you are overweight or have a high risk for diabetes. What should I know about preventing infection? Hepatitis B If you have a higher risk for hepatitis B, you should be screened for this virus. Talk with your health care provider to find out if you are at risk for hepatitis B infection. Hepatitis C Blood testing is recommended for: Everyone born from [id number] through 1965. Anyone with known risk factors for hepatitis C. Sexually transmitted infections (STIs) You should be screened each year for STIs, including gonorrhea and chlamydia, if: You are sexually active and are younger than 44 years of age. You are older than 44 years of age and your health care provider tells you that you are at risk for this type of infection. Your sexual activity has changed since you were last screened, and you are at increased risk for chlamydia or gonorrhea. Ask your health care provider if you are at risk. Ask your health care provider about whether you are at high risk for HIV. Your health care provider may recommend a prescription medicine to help prevent HIV infection. If you choose to take medicine to prevent HIV, you should first get tested for HIV. You should then be tested every 3 months for as long as you are taking the medicine. Follow these instructions at home: Alcohol use Do not drink alcohol if your health care provider tells you not to drink. If you drink alcohol: Limit how much you have to 0-2 drinks a day. Know how much alcohol is in your drink.  In the [place] one drink equals one 12 oz bottle of beer (355 mL), one 5 oz glass of wine (148 mL), or one 1 oz glass of hard liquor (44 mL). Lifestyle Do not use any products that contain nicotine or tobacco. These products include cigarettes, chewing tobacco, and vaping devices, such as e-cigarettes. If you need help quitting, ask your health care provider. Do not use street drugs. Do not share needles. Ask your health care provider for help if you need support or information about quitting drugs. General instructions Schedule regular health, dental, and eye exams. Stay current with your vaccines. Tell your health care provider if: You often feel depressed. You have ever been abused or do not feel safe at home. Summary Adopting a healthy lifestyle and getting preventive care are important in promoting health and wellness. Follow your health care provider's instructions about healthy diet, exercising, and getting tested or screened for diseases. Follow your health care provider's instructions on monitoring your cholesterol and blood pressure. This information is not intended to replace advice given to you by your health care provider. Make sure you discuss any questions you have with your health care provider. Document Revised: 12/13/2020 Document Reviewed: 12/13/2020 Elsevier Patient Education  2023 Elsevier Inc.     Edwina BarthMiguel Deondra Labrador, MD [facility]

## 2022-01-18 NOTE — Patient Instructions (Signed)
Health Maintenance, Male Adopting a healthy lifestyle and getting preventive care are important in promoting health and wellness. Ask your health care provider about: The right schedule for you to have regular tests and exams. Things you can do on your own to prevent diseases and keep yourself healthy. What should I know about diet, weight, and exercise? Eat a healthy diet  Eat a diet that includes plenty of vegetables, fruits, low-fat dairy products, and lean protein. Do not eat a lot of foods that are high in solid fats, added sugars, or sodium. Maintain a healthy weight Body mass index (BMI) is a measurement that can be used to identify possible weight problems. It estimates body fat based on height and weight. Your health care provider can help determine your BMI and help you achieve or maintain a healthy weight. Get regular exercise Get regular exercise. This is one of the most important things you can do for your health. Most adults should: Exercise for at least 150 minutes each week. The exercise should increase your heart rate and make you sweat (moderate-intensity exercise). Do strengthening exercises at least twice a week. This is in addition to the moderate-intensity exercise. Spend less time sitting. Even light physical activity can be beneficial. Watch cholesterol and blood lipids Have your blood tested for lipids and cholesterol at 44 years of age, then have this test every 5 years. You may need to have your cholesterol levels checked more often if: Your lipid or cholesterol levels are high. You are older than 44 years of age. You are at high risk for heart disease. What should I know about cancer screening? Many types of cancers can be detected early and may often be prevented. Depending on your health history and family history, you may need to have cancer screening at various ages. This may include screening for: Colorectal cancer. Prostate cancer. Skin cancer. Lung  cancer. What should I know about heart disease, diabetes, and high blood pressure? Blood pressure and heart disease High blood pressure causes heart disease and increases the risk of stroke. This is more likely to develop in people who have high blood pressure readings or are overweight. Talk with your health care provider about your target blood pressure readings. Have your blood pressure checked: Every 3-5 years if you are 18-39 years of age. Every year if you are 40 years old or older. If you are between the ages of 65 and 75 and are a current or former smoker, ask your health care provider if you should have a one-time screening for abdominal aortic aneurysm (AAA). Diabetes Have regular diabetes screenings. This checks your fasting blood sugar level. Have the screening done: Once every three years after age 45 if you are at a normal weight and have a low risk for diabetes. More often and at a younger age if you are overweight or have a high risk for diabetes. What should I know about preventing infection? Hepatitis B If you have a higher risk for hepatitis B, you should be screened for this virus. Talk with your health care provider to find out if you are at risk for hepatitis B infection. Hepatitis C Blood testing is recommended for: Everyone born from 1945 through 1965. Anyone with known risk factors for hepatitis C. Sexually transmitted infections (STIs) You should be screened each year for STIs, including gonorrhea and chlamydia, if: You are sexually active and are younger than 44 years of age. You are older than 44 years of age and your   health care provider tells you that you are at risk for this type of infection. Your sexual activity has changed since you were last screened, and you are at increased risk for chlamydia or gonorrhea. Ask your health care provider if you are at risk. Ask your health care provider about whether you are at high risk for HIV. Your health care provider  may recommend a prescription medicine to help prevent HIV infection. If you choose to take medicine to prevent HIV, you should first get tested for HIV. You should then be tested every 3 months for as long as you are taking the medicine. Follow these instructions at home: Alcohol use Do not drink alcohol if your health care provider tells you not to drink. If you drink alcohol: Limit how much you have to 0-2 drinks a day. Know how much alcohol is in your drink. In the [place] one drink equals one 12 oz bottle of beer (355 mL), one 5 oz glass of wine (148 mL), or one 1 oz glass of hard liquor (44 mL). Lifestyle Do not use any products that contain nicotine or tobacco. These products include cigarettes, chewing tobacco, and vaping devices, such as e-cigarettes. If you need help quitting, ask your health care provider. Do not use street drugs. Do not share needles. Ask your health care provider for help if you need support or information about quitting drugs. General instructions Schedule regular health, dental, and eye exams. Stay current with your vaccines. Tell your health care provider if: You often feel depressed. You have ever been abused or do not feel safe at home. Summary Adopting a healthy lifestyle and getting preventive care are important in promoting health and wellness. Follow your health care provider's instructions about healthy diet, exercising, and getting tested or screened for diseases. Follow your health care provider's instructions on monitoring your cholesterol and blood pressure. This information is not intended to replace advice given to you by your health care provider. Make sure you discuss any questions you have with your health care provider. Document Revised: 12/13/2020 Document Reviewed: 12/13/2020 Elsevier Patient Education  2023 Elsevier Inc.  

## 2022-01-19 LAB — HEPATITIS C ANTIBODY
Hepatitis C Ab: NONREACTIVE
SIGNAL TO CUT-OFF: 0.05 (ref <1.00)

## 2022-03-11 ENCOUNTER — Other Ambulatory Visit: Payer: Self-pay

## 2022-03-11 ENCOUNTER — Encounter: Payer: Self-pay | Admitting: Emergency Medicine

## 2022-03-11 ENCOUNTER — Emergency Department
Admission: EM | Admit: 2022-03-11 | Discharge: 2022-03-11 | Disposition: A | Discharge status: Home / Self Care | Payer: BC Managed Care – PPO | Attending: Emergency Medicine | Admitting: Emergency Medicine

## 2022-03-11 DIAGNOSIS — K2970 Gastritis, unspecified, without bleeding: Secondary | ICD-10-CM | POA: Insufficient documentation

## 2022-03-11 DIAGNOSIS — R1012 Left upper quadrant pain: Secondary | ICD-10-CM | POA: Diagnosis present

## 2022-03-11 DIAGNOSIS — R109 Unspecified abdominal pain: Secondary | ICD-10-CM

## 2022-03-11 LAB — COMPREHENSIVE METABOLIC PANEL
ALT: 31 U/L (ref 0–44)
AST: 24 U/L (ref 15–41)
Albumin: 4.5 g/dL (ref 3.5–5.0)
Alkaline Phosphatase: 63 U/L (ref 38–126)
Anion gap: 8 (ref 5–15)
BUN: 14 mg/dL (ref 6–20)
CO2: 24 mmol/L (ref 22–32)
Calcium: 9.6 mg/dL (ref 8.9–10.3)
Chloride: 107 mmol/L (ref 98–111)
Creatinine, Ser: 0.91 mg/dL (ref 0.61–1.24)
GFR, Estimated: >60 mL/min (ref >60)
Glucose, Bld: 129 mg/dL — ABNORMAL HIGH (ref 70–99)
Potassium: 3.5 mmol/L (ref 3.5–5.1)
Sodium: 139 mmol/L (ref 135–145)
Total Bilirubin: 0.7 mg/dL (ref 0.3–1.2)
Total Protein: 7.9 g/dL (ref 6.5–8.1)

## 2022-03-11 LAB — URINALYSIS, ROUTINE W REFLEX MICROSCOPIC
Bilirubin Urine: NEGATIVE
Glucose, UA: NEGATIVE mg/dL
Hgb urine dipstick: NEGATIVE
Ketones, ur: NEGATIVE mg/dL
Leukocytes,Ua: NEGATIVE
Nitrite: NEGATIVE
Protein, ur: NEGATIVE mg/dL
Specific Gravity, Urine: 1.016 (ref 1.005–1.030)
pH: 5 (ref 5.0–8.0)

## 2022-03-11 LAB — CBC
HCT: 50.1 % (ref 39.0–52.0)
Hemoglobin: 17.1 g/dL — ABNORMAL HIGH (ref 13.0–17.0)
MCH: 30.6 pg (ref 26.0–34.0)
MCHC: 34.1 g/dL (ref 30.0–36.0)
MCV: 89.8 fL (ref 80.0–100.0)
Platelets: 285 10*3/uL (ref 150–400)
RBC: 5.58 MIL/uL (ref 4.22–5.81)
RDW: 12.1 % (ref 11.5–15.5)
WBC: 9.4 10*3/uL (ref 4.0–10.5)
nRBC: 0 % (ref 0.0–0.2)

## 2022-03-11 LAB — TROPONIN I (HIGH SENSITIVITY): Troponin I (High Sensitivity): 5 ng/L (ref <18)

## 2022-03-11 LAB — LIPASE, BLOOD: Lipase: 30 U/L (ref 11–51)

## 2022-03-11 MED ORDER — FAMOTIDINE 20 MG PO TABS: 20.0000 mg | ORAL_TABLET | Freq: Every day | ORAL | 1 refills | Status: DC

## 2022-03-11 MED ORDER — ALUM & MAG HYDROXIDE-SIMETH 200-200-20 MG/5ML PO SUSP
30.0000 mL | Freq: Once | ORAL | Status: AC
  Administered 2022-03-11: 30 mL via ORAL
  Filled 2022-03-11: qty 30

## 2022-03-11 MED ORDER — LIDOCAINE VISCOUS HCL 2 % MT SOLN
15.0000 mL | Freq: Once | OROMUCOSAL | Status: AC
  Administered 2022-03-11: 15 mL via ORAL
  Filled 2022-03-11: qty 15

## 2022-03-11 NOTE — ED Provider Notes (Signed)
[facility] Provider Note    Event Date/Time   First MD Initiated Contact with Patient 03/11/22 1553     (approximate)   History   Abdominal Pain   HPI  Victor Delgado is a 46 y.o. male  who presents to the emergency department today because of concerns for left upper quadrant abdominal pain.  It started 3 nights ago.  Does seem to be worse at night.  It varies from being dull to a sharp pain.  It does get worse after he eats.  Patient has not had any associated nausea or vomiting.  Did feel short of breath 1 night with this.  Denies any cough.  Denies any fevers.  Denies any unusual ingestion or exertion.  States he thinks he has had somewhat similar pain very occasionally in the past.  Tried some Gas-X without any relief.      Physical Exam   Triage Vital Signs: ED Triage Vitals  Enc Vitals Group     BP 03/11/22 1431 136/85     Pulse Rate 03/11/22 1431 81     Resp 03/11/22 1431 17     Temp 03/11/22 1431 98.5 F (36.9 C)     Temp Source 03/11/22 1431 Oral     SpO2 03/11/22 1431 100 %     Weight 03/11/22 1431 240 lb (108.9 kg)     Height 03/11/22 1431 6\' 1"  (1.854 m)     Head Circumference --      Peak Flow --      Pain Score 03/11/22 1436 1     Pain Loc --      Pain Edu? --      Excl. in GC? --     Most recent vital signs: Vitals:   03/11/22 1431  BP: 136/85  Pulse: 81  Resp: 17  Temp: 98.5 F (36.9 C)  SpO2: 100%     General: Awake, alert, oriented. CV:  Good peripheral perfusion. Regular rate and rhythm. Resp:  Normal effort. Lungs clear. Abd:  No distention. Non tender. Other:  No rash   ED Results / Procedures / Treatments   Labs (all labs ordered are listed, but only abnormal results are displayed) Labs Reviewed  COMPREHENSIVE METABOLIC PANEL - Abnormal; Notable for the following components:      Result Value   Glucose, Bld 129 (*)    All other components within normal limits  CBC - Abnormal; Notable for the  following components:   Hemoglobin 17.1 (*)    All other components within normal limits  LIPASE, BLOOD  URINALYSIS, ROUTINE W REFLEX MICROSCOPIC  TROPONIN I (HIGH SENSITIVITY)     EKG  I, 05/11/22, attending physician, personally viewed and interpreted this EKG  EKG Time: 1439 Rate: 71 Rhythm: normal sinus rhythm Axis: normal Intervals: qtc 489 QRS: LBBB ST changes: no st elevation Impression: abnormal ekg   RADIOLOGY None   PROCEDURES:  Critical Care performed: No  Procedures   MEDICATIONS ORDERED IN ED: Medications - No data to display   IMPRESSION / MDM / ASSESSMENT AND PLAN / ED COURSE  I reviewed the triage vital signs and the nursing notes.                              Differential diagnosis includes, but is not limited to, ACS, gastritis, pancreatitis.  Patient's presentation is most consistent with acute presentation with potential threat to life  or bodily function.  Patient presented to the emergency department today because of concerns for left upper quadrant abdominal pain.  The patient had blood work performed which did not show any concerning elevation of troponin or lipase.  EKG did show left bundle branch block which is new from EKG that was seen in our system from 2017.  However given that troponin is negative and symptoms have been ongoing for days I doubt ACS.  Patient did feel better after GI cocktail.  At this time I think gastritis unlikely.  I discussed this with the patient.  Will plan on discharging.  Will give patient GI follow-up information.  FINAL CLINICAL IMPRESSION(S) / ED DIAGNOSES   Final diagnoses:  Abdominal pain, unspecified abdominal location  Gastritis, presence of bleeding unspecified, unspecified chronicity, unspecified gastritis type     Note:  This document was prepared using Dragon voice recognition software and may include unintentional dictation errors.    Phineas Semen, MD 03/11/22 Windy Fast

## 2022-03-11 NOTE — ED Notes (Signed)
Lab called about status of add on troponin

## 2022-03-11 NOTE — Discharge Instructions (Signed)
Please seek medical attention for any high fevers, chest pain, shortness of breath, change in behavior, persistent vomiting, bloody stool or any other new or concerning symptoms.  

## 2022-03-11 NOTE — ED Provider Triage Note (Signed)
Emergency Medicine Provider Triage Evaluation Note  TASMAN ZAPATA , a 46 y.o. male  was evaluated in triage.  Pt complains of left sided chest pain x 3 nights ago. Woke up with pain during the night and some SOB. Pain also radiated up into shoulder area.    Review of Systems  Positive: Hx of hypertension.  + some indigestion.   Negative: Had negative stress test "years ago"   No kidney stones or UTI history.    Physical Exam  There were no vitals taken for this visit. Gen:   Awake, no distress   Resp:  Normal effort  MSK:   Moves extremities without difficulty  Other:    Medical Decision Making  Medically screening exam initiated at 2:33 PM.  Appropriate orders placed.  Hurshel Bouillon Bostick was informed that the remainder of the evaluation will be completed by another provider, this initial triage assessment does not replace that evaluation, and the importance of remaining in the ED until their evaluation is complete.     Tommi Rumps, PA-C 03/11/22 1437

## 2022-03-11 NOTE — ED Triage Notes (Signed)
Patient to ER for c/o upper left abd pain that radiates into back. Patient states pain "feels like gas", but is unsure.

## 2022-03-27 ENCOUNTER — Telehealth: Payer: BC Managed Care – PPO | Admitting: Emergency Medicine

## 2022-03-27 DIAGNOSIS — U071 COVID-19: Secondary | ICD-10-CM

## 2022-03-27 NOTE — Progress Notes (Signed)
Virtual Visit Consent   Victor Delgado, you are scheduled for a virtual visit with a [facility] provider today. Just as with appointments in the office, your consent must be obtained to participate. Your consent will be active for this visit and any virtual visit you may have with one of our providers in the next 365 days. If you have a MyChart account, a copy of this consent can be sent to you electronically.  As this is a virtual visit, video technology does not allow for your provider to perform a traditional examination. This may limit your provider's ability to fully assess your condition. If your provider identifies any concerns that need to be evaluated in person or the need to arrange testing (such as labs, EKG, etc.), we will make arrangements to do so. Although advances in technology are sophisticated, we cannot ensure that it will always work on either your end or our end. If the connection with a video visit is poor, the visit may have to be switched to a telephone visit. With either a video or telephone visit, we are not always able to ensure that we have a secure connection.  By engaging in this virtual visit, you consent to the provision of healthcare and authorize for your insurance to be billed (if applicable) for the services provided during this visit. Depending on your insurance coverage, you may receive a charge related to this service.  I need to obtain your verbal consent now. Are you willing to proceed with your visit today? Victor Delgado has provided verbal consent on 03/27/2022 for a virtual visit (video or telephone). Victor Horseman, PA-C  Date: 03/27/2022 11:46 AM  Virtual Visit via Video Note   I, Victor Delgado, connected with  Victor Delgado  ([id number], [birth year]) on 03/27/22 at 11:45 AM EDT by a video-enabled telemedicine application and verified that I am speaking with the correct person using two identifiers.  Location: Patient: Virtual Visit Location Patient:  Home Provider: Virtual Visit Location Provider: Home Office   I discussed the limitations of evaluation and management by telemedicine and the availability of in person appointments. The patient expressed understanding and agreed to proceed.    History of Present Illness: Victor Delgado is a 46 y.o. who identifies as a male who was assigned male at birth, and is being seen today for COVID.  States that he has felt badly since 2 days ago.  He states that he has had cough, fever, chills, body aches.  States that he had 2 positive home tests.  He asks about treatment.  States that he has been taking OTC meds.    HPI: HPI  Problems:  Patient Active Problem List   Diagnosis Date Noted   Chronic constipation 05/03/2018   CAD (coronary artery disease) 07/26/2016   Essential hypertension 04/26/2016   Hyperlipidemia 04/26/2016   Obesity (BMI 30-39.9) 04/26/2016    Allergies: No Known Allergies Medications:  Current Outpatient Medications:    benzonatate (TESSALON) 200 MG capsule, Take 1 capsule (200 mg total) by mouth 2 (two) times daily as needed for cough. (Patient not taking: Reported on 01/18/2022), Disp: 20 capsule, Rfl: 0   cetirizine (ZYRTEC ALLERGY) 10 MG tablet, Take 1 tablet (10 mg total) by mouth daily for 7 days., Disp: 30 tablet, Rfl: 0   ezetimibe (ZETIA) 10 MG tablet, TAKE 1 TABLET BY MOUTH EVERY DAY, Disp: 90 tablet, Rfl: 3   famotidine (PEPCID) 20 MG tablet, Take 1 tablet (20 mg total)  by mouth daily., Disp: 30 tablet, Rfl: 1   fluticasone (FLONASE) 50 MCG/ACT nasal spray, Place 1 spray into both nostrils daily for 7 days., Disp: 9.9 mL, Rfl: 2   HYDROcodone bit-homatropine (HYCODAN) 5-1.5 MG/5ML syrup, Take 5 mLs by mouth every 6 (six) hours as needed for cough. (Patient not taking: Reported on 01/18/2022), Disp: 160 mL, Rfl: 0   meloxicam (MOBIC) 15 MG tablet, Take 1 tablet (15 mg total) by mouth daily., Disp: 30 tablet, Rfl: 3   Multiple Vitamin (MULTI VITAMIN MENS PO), Take by  mouth daily. (Patient not taking: Reported on 01/18/2022), Disp: , Rfl:    rifaximin (XIFAXAN) 550 MG TABS tablet, 1 tab(s) (Patient not taking: Reported on 01/18/2022), Disp: , Rfl:   Observations/Objective: Patient is well-developed, well-nourished in no acute distress.  Resting comfortably  at home.  Head is normocephalic, atraumatic.  No labored breathing.  Speech is clear and coherent with logical content.  Patient is alert and oriented at baseline.    Assessment and Plan: 1. COVID-19   - Offered antiviral based on risk factors - Declined. - Offered Tessalon for cough, he states he has some. - Recommend continued supportive care.  Follow Up Instructions: I discussed the assessment and treatment plan with the patient. The patient was provided an opportunity to ask questions and all were answered. The patient agreed with the plan and demonstrated an understanding of the instructions.  A copy of instructions were sent to the patient via MyChart unless otherwise noted below.     The patient was advised to call back or seek an in-person evaluation if the symptoms worsen or if the condition fails to improve as anticipated.  Time:  I spent 11 minutes with the patient via telehealth technology discussing the above problems/concerns.    Victor Horseman, PA-C

## 2022-09-10 ENCOUNTER — Emergency Department
Admission: EM | Admit: 2022-09-10 | Discharge: 2022-09-10 | Disposition: A | Discharge status: Home / Self Care | Payer: BC Managed Care – PPO | Attending: Emergency Medicine | Admitting: Emergency Medicine

## 2022-09-10 ENCOUNTER — Emergency Department: Payer: BC Managed Care – PPO

## 2022-09-10 DIAGNOSIS — X500XXA Overexertion from strenuous movement or load, initial encounter: Secondary | ICD-10-CM | POA: Insufficient documentation

## 2022-09-10 DIAGNOSIS — S56911A Strain of unspecified muscles, fascia and tendons at forearm level, right arm, initial encounter: Secondary | ICD-10-CM | POA: Diagnosis not present

## 2022-09-10 DIAGNOSIS — I2510 Atherosclerotic heart disease of native coronary artery without angina pectoris: Secondary | ICD-10-CM | POA: Diagnosis not present

## 2022-09-10 DIAGNOSIS — S59911A Unspecified injury of right forearm, initial encounter: Secondary | ICD-10-CM | POA: Diagnosis present

## 2022-09-10 DIAGNOSIS — Z79899 Other long term (current) drug therapy: Secondary | ICD-10-CM | POA: Diagnosis not present

## 2022-09-10 DIAGNOSIS — I10 Essential (primary) hypertension: Secondary | ICD-10-CM | POA: Diagnosis not present

## 2022-09-10 DIAGNOSIS — Y93F2 Activity, caregiving, lifting: Secondary | ICD-10-CM | POA: Diagnosis not present

## 2022-09-10 MED ORDER — OXYCODONE-ACETAMINOPHEN 5-325 MG PO TABS
1.0000 | ORAL_TABLET | Freq: Once | ORAL | Status: AC
  Administered 2022-09-10: 1 via ORAL
  Filled 2022-09-10: qty 1

## 2022-09-10 MED ORDER — OXYCODONE-ACETAMINOPHEN 5-325 MG PO TABS: 1.0000 | ORAL_TABLET | Freq: Four times a day (QID) | ORAL | 0 refills | Status: AC | PRN

## 2022-09-10 MED ORDER — GADOBUTROL 1 MMOL/ML IV SOLN
10.0000 mL | Freq: Once | INTRAVENOUS | Status: AC | PRN
  Administered 2022-09-10: 10 mL via INTRAVENOUS

## 2022-09-10 NOTE — ED Notes (Signed)
Pt care taken, awaiting an mri and results. No other complaints at this time.

## 2022-09-10 NOTE — ED Provider Notes (Signed)
  Physical Exam  BP (!) 140/98 (BP Location: Right Arm)   Pulse 66   Temp 98.2 F (36.8 C) (Oral)   Resp 18   Wt 116.1 kg   SpO2 96%   BMI 33.78 kg/m   Physical Exam  Constitutional: Alert and oriented. Well appearing and in no acute distress. Eyes: Conjunctivae are normal. PERRL. EOMI. Head: Atraumatic. Nose: No congestion/rhinnorhea. Mouth/Throat: Mucous membranes are moist.  Oropharynx non-erythematous. Neck: No stridor.   Cardiovascular: Normal rate, regular rhythm. Grossly normal heart sounds.  Good peripheral circulation. Respiratory: Normal respiratory effort.  No retractions. Lungs CTAB. Gastrointestinal: Soft and nontender. No distention. No abdominal bruits. No CVA tenderness. Musculoskeletal: Right forearm pain. Patient has full range of motion to all digits, wrist, elbow, and shoulder without difficulty.  Patient reports that with any rotation of the arm he has significant pain in the inner right forearm.  Patient also reports that he is unable to lift his arm.  However, he is observed raising the arm at the shoulder and at the elbow.  There is no crepitus or grating sensation felt/heard with movement.  Patient has good sensation to all digits.  Cap refills less than 2 seconds in all digits.  Patient pulses are normal.  No redness/swelling noted.  Neurologic:  Normal speech and language. No gross focal neurologic deficits are appreciated. No gait instability. Skin:  Skin is warm, dry and intact. No rash noted. Psychiatric: Mood and affect are normal. Speech and behavior are normal.  Procedures  Procedures  ED Course / MDM    Medical Decision Making Amount and/or Complexity of Data Reviewed Radiology: ordered.  Risk Prescription drug management.   This patient's care was transferred over to me from Galen Daft at approximately 2100.  Plan is to await the results of the forearm MRI and reevaluate.  MRI of the forearm shows mild edema of the pronator quadratus  muscle in the distal forearm, concerning for muscle strain.  Otherwise no evidence of intramuscular hematomas or significant fluid collections.  On reexamination, patient appears well.  Pulses intact.  Sensation intact.  Normal grip strength.  I believe he is safe for discharge with orthopedic follow-up to be used as needed.  Will provide him with a brief course of oxycodone/acetaminophen as needed for the pain.  Provide him with anticipatory guidance and strict return precautions, including for any symptoms concerning of compartment syndrome.  Patient expressed understanding and agreed with the plan.  Will discharge.        Teodoro Spray, [place] 09/10/22 2210    Lucillie Garfinkel, MD 09/10/22 [phone]

## 2022-09-10 NOTE — ED Provider Notes (Signed)
[facility] Emergency Department Provider Note   ____________________________________________   Event Date/Time   First MD Initiated Contact with Patient 09/10/22 1618     (approximate)  I have reviewed the triage vital signs and the nursing notes.   HISTORY  Chief Complaint Arm Pain    HPI CODA Victor Delgado is a 47 y.o. male presents to the emergency room for complaint of right forearm pain.  Patient reports that he was lifting a piece of sheet rock onto his truck to take back to [employer].  He reports that when he lifted it he felt a pop in his forearm.  Since that time he has been having pain to the right forearm. Patient has not taken anything for the pain at this time.  He reports that the pain is 5-6 out of 10.   Past Medical History:  Diagnosis Date   CAD (coronary artery disease) 07/26/2016   Chest pain 03/09/2016   Essential hypertension 04/26/2016   Hyperlipidemia 04/26/2016   Obesity (BMI 30-39.9) 04/26/2016    Patient Active Problem List   Diagnosis Date Noted   Chronic constipation 05/03/2018   CAD (coronary artery disease) 07/26/2016   Essential hypertension 04/26/2016   Hyperlipidemia 04/26/2016   Obesity (BMI 30-39.9) 04/26/2016    Past Surgical History:  Procedure Laterality Date   APPENDECTOMY  2002   arm surgery Right    per patient right and 20 years ago    Prior to Admission medications   Medication Sig Start Date End Date Taking? Authorizing Provider  benzonatate (TESSALON) 200 MG capsule Take 1 capsule (200 mg total) by mouth 2 (two) times daily as needed for cough. Patient not taking: Reported on 01/18/2022 11/15/21   Horald Pollen, MD  cetirizine (ZYRTEC ALLERGY) 10 MG tablet Take 1 tablet (10 mg total) by mouth daily for 7 days. 11/09/21 11/16/21  Lannie Fields, PA-C  ezetimibe (ZETIA) 10 MG tablet TAKE 1 TABLET BY MOUTH EVERY DAY 12/15/21   Horald Pollen, MD  famotidine (PEPCID) 20 MG tablet Take 1 tablet  (20 mg total) by mouth daily. 03/11/22 03/11/23  Nance Pear, MD  fluticasone (FLONASE) 50 MCG/ACT nasal spray Place 1 spray into both nostrils daily for 7 days. 11/09/21 11/16/21  Lannie Fields, PA-C  HYDROcodone bit-homatropine (HYCODAN) 5-1.5 MG/5ML syrup Take 5 mLs by mouth every 6 (six) hours as needed for cough. Patient not taking: Reported on 01/18/2022 11/15/21   Horald Pollen, MD  meloxicam (MOBIC) 15 MG tablet Take 1 tablet (15 mg total) by mouth daily. 01/18/22 01/18/23  Horald Pollen, MD  Multiple Vitamin (MULTI VITAMIN MENS PO) Take by mouth daily. Patient not taking: Reported on 01/18/2022    [provider]  rifaximin (XIFAXAN) 550 MG TABS tablet 1 tab(s) Patient not taking: Reported on 01/18/2022 02/03/09   [provider]    Allergies Patient has no known allergies.  Family History  Problem Relation Age of Onset   Hypertension Mother    Diabetes Mother    Heart attack Paternal Uncle    Stroke Maternal Grandmother    Stroke Maternal Grandfather    Heart attack Paternal Grandfather    Esophageal cancer Neg Hx    Pancreatic cancer Neg Hx    Stomach cancer Neg Hx    Liver disease Neg Hx    Colon cancer Neg Hx     Social History Social History   Tobacco Use   Smoking status: Never  Smokeless tobacco: Never  Substance Use Topics   Alcohol use: No    Alcohol/week: 0.0 standard drinks of alcohol   Drug use: No    Review of Systems  Constitutional: No fever/chills Eyes: No visual changes. ENT: No sore throat. Cardiovascular: Denies chest pain. Respiratory: Denies shortness of breath. Gastrointestinal: No abdominal pain.  No nausea, no vomiting.  No diarrhea.  No constipation. Genitourinary: Negative for dysuria. Musculoskeletal: Positive for right forearm pain. Skin: Negative for rash. Neurological: Negative for headaches, focal weakness or numbness.   ____________________________________________   PHYSICAL EXAM:  VITAL  SIGNS: ED Triage Vitals  Enc Vitals Group     BP 09/10/22 1602 (!) 150/101     Pulse Rate 09/10/22 1602 76     Resp 09/10/22 1602 18     Temp 09/10/22 1602 98.1 F (36.7 C)     Temp Source 09/10/22 1602 Oral     SpO2 09/10/22 1602 98 %     Weight 09/10/22 1601 256 lb (116.1 kg)     Height --      Head Circumference --      Peak Flow --      Pain Score 09/10/22 1601 7     Pain Loc --      Pain Edu? --      Excl. in [place]? --     Constitutional: Alert and oriented. Well appearing and in no acute distress. Eyes: Conjunctivae are normal. PERRL. EOMI. Head: Atraumatic. Nose: No congestion/rhinnorhea. Mouth/Throat: Mucous membranes are moist.  Oropharynx non-erythematous. Neck: No stridor.   Cardiovascular: Normal rate, regular rhythm. Grossly normal heart sounds.  Good peripheral circulation. Respiratory: Normal respiratory effort.  No retractions. Lungs CTAB. Gastrointestinal: Soft and nontender. No distention. No abdominal bruits. No CVA tenderness. Musculoskeletal: Right forearm pain. Patient has full range of motion to all digits, wrist, elbow, and shoulder without difficulty.  Patient reports that with any rotation of the arm he has significant pain in the inner right forearm.  Patient also reports that he is unable to lift his arm.  However, he is observed raising the arm at the shoulder and at the elbow.  There is no crepitus or grating sensation felt/heard with movement.  Patient has good sensation to all digits.  Cap refills less than 2 seconds in all digits.  Patient pulses are normal.  No redness/swelling noted.  Neurologic:  Normal speech and language. No gross focal neurologic deficits are appreciated. No gait instability. Skin:  Skin is warm, dry and intact. No rash noted. Psychiatric: Mood and affect are normal. Speech and behavior are normal.  ____________________________________________   LABS (all labs ordered are listed, but only abnormal results are displayed)  Labs  Reviewed - No data to display ____________________________________________  EKG   ____________________________________________  RADIOLOGY  ED MD interpretation:    Official radiology report(s): No results found.  ____________________________________________   PROCEDURES  Procedure(s) performed: None  Procedures  Critical Care performed: No  ____________________________________________   INITIAL IMPRESSION / ASSESSMENT AND PLAN / ED COURSE    TYELER GOEDKEN is a 47 y.o. male presents to the emergency room for complaint of right forearm pain.  Patient reports that he was lifting a piece of sheet rock onto his truck to take back to [employer].  He reports that when he lifted it he felt a pop in his forearm.  Since that time he has been having pain to the right forearm. Patient has not taken anything for the pain at  this time.  He reports that the pain is 5-6 out of 10.   Discussed normal exam with Dr Ellender Hose and agreed that pt would be discharged to follow up with Ortho   To patient's room to discuss that his exam was relatively unremarkable and that he would need to follow-up with orthopedics.  After discussion with patient and his wife, patient states that he can no longer feel his fingers. Performed another exam of right forearm/finger/wrist with no change in my assessment at this time. However, based on patient's report we will order MRI of the right forearm.  Still awaiting MRI results so handoff given to Mardee Postin     ____________________________________________   FINAL CLINICAL IMPRESSION(S) / ED DIAGNOSES  Final diagnoses:  None     ED Discharge Orders     None        Note:  This document was prepared using Dragon voice recognition software and may include unintentional dictation errors.     Willaim Rayas, NP 09/10/22 2106    Duffy Bruce, MD 09/11/22 [phone]

## 2022-09-10 NOTE — ED Triage Notes (Signed)
Pt sts that he has right arm pain after lifting something heavy. PT sts that he felt a pop.

## 2022-09-10 NOTE — Discharge Instructions (Addendum)
-  Your MRI showed that you may have strained your forearm.  This will likely heal on its own.  Recommend rest with ice periodically throughout the day for the next few days.  You may take Tylenol/ibuprofen as needed for pain.  If needed, you may utilize the oxycodone, the use caution as it may make you dizzy/drowsy.  -Please follow-up with the orthopedist listed on this page if your symptoms fail to improve after 1 to 2 weeks.  You may call him to schedule an appointment.  -Return to the emergency department anytime if you begin to experience any new or worsening symptoms.

## 2022-09-18 ENCOUNTER — Encounter: Payer: Self-pay | Admitting: Emergency Medicine

## 2022-09-18 ENCOUNTER — Ambulatory Visit (INDEPENDENT_AMBULATORY_CARE_PROVIDER_SITE_OTHER): Payer: BC Managed Care – PPO | Admitting: Emergency Medicine

## 2022-09-18 VITALS — BP 132/82 | HR 55 | Temp 97.8°F | Ht 73.0 in | Wt 285.0 lb

## 2022-09-18 DIAGNOSIS — S56911A Strain of unspecified muscles, fascia and tendons at forearm level, right arm, initial encounter: Secondary | ICD-10-CM | POA: Diagnosis not present

## 2022-09-18 DIAGNOSIS — Z23 Encounter for immunization: Secondary | ICD-10-CM

## 2022-09-18 DIAGNOSIS — I10 Essential (primary) hypertension: Secondary | ICD-10-CM

## 2022-09-18 DIAGNOSIS — E785 Hyperlipidemia, unspecified: Secondary | ICD-10-CM

## 2022-09-18 LAB — LIPID PANEL
Cholesterol: 257 mg/dL — ABNORMAL HIGH (ref 0–200)
HDL: 45.9 mg/dL (ref >39.00)
NonHDL: 211.26
Total CHOL/HDL Ratio: 6
Triglycerides: 253 mg/dL — ABNORMAL HIGH (ref 0.0–149.0)
VLDL: 50.6 mg/dL — ABNORMAL HIGH (ref 0.0–40.0)

## 2022-09-18 LAB — COMPREHENSIVE METABOLIC PANEL
ALT: 29 U/L (ref 0–53)
AST: 17 U/L (ref 0–37)
Albumin: 4.5 g/dL (ref 3.5–5.2)
Alkaline Phosphatase: 68 U/L (ref 39–117)
BUN: 16 mg/dL (ref 6–23)
CO2: 27 mEq/L (ref 19–32)
Calcium: 9.7 mg/dL (ref 8.4–10.5)
Chloride: 102 mEq/L (ref 96–112)
Creatinine, Ser: 0.9 mg/dL (ref 0.40–1.50)
GFR: 103.55 mL/min (ref >60.00)
Glucose, Bld: 70 mg/dL (ref 70–99)
Potassium: 4 mEq/L (ref 3.5–5.1)
Sodium: 139 mEq/L (ref 135–145)
Total Bilirubin: 0.4 mg/dL (ref 0.2–1.2)
Total Protein: 7.2 g/dL (ref 6.0–8.3)

## 2022-09-18 LAB — CBC WITH DIFFERENTIAL/PLATELET
Basophils Absolute: 0 10*3/uL (ref 0.0–0.1)
Basophils Relative: 0.5 % (ref 0.0–3.0)
Eosinophils Absolute: 0.2 10*3/uL (ref 0.0–0.7)
Eosinophils Relative: 2.2 % (ref 0.0–5.0)
HCT: 49.1 % (ref 39.0–52.0)
Hemoglobin: 16.9 g/dL (ref 13.0–17.0)
Lymphocytes Relative: 25.8 % (ref 12.0–46.0)
Lymphs Abs: 1.9 10*3/uL (ref 0.7–4.0)
MCHC: 34.5 g/dL (ref 30.0–36.0)
MCV: 91.3 fl (ref 78.0–100.0)
Monocytes Absolute: 0.6 10*3/uL (ref 0.1–1.0)
Monocytes Relative: 7.6 % (ref 3.0–12.0)
Neutro Abs: 4.6 10*3/uL (ref 1.4–7.7)
Neutrophils Relative %: 63.9 % (ref 43.0–77.0)
Platelets: 296 10*3/uL (ref 150.0–400.0)
RBC: 5.37 Mil/uL (ref 4.22–5.81)
RDW: 13 % (ref 11.5–15.5)
WBC: 7.2 10*3/uL (ref 4.0–10.5)

## 2022-09-18 LAB — SEDIMENTATION RATE: Sed Rate: 17 mm/hr — ABNORMAL HIGH (ref 0–15)

## 2022-09-18 LAB — LDL CHOLESTEROL, DIRECT: Direct LDL: 173 mg/dL

## 2022-09-18 NOTE — Assessment & Plan Note (Signed)
Slowly improving.  Recent MRI report reviewed.  No signs of tendon rupture. Pain management discussed.  Continue Tylenol and or Advil as needed. Lifting restrictions recommended. May need orthopedic evaluation if no better or worse during the next couple of weeks.

## 2022-09-18 NOTE — Assessment & Plan Note (Signed)
Well-controlled hypertension.  Off medications. BP Readings from Last 3 Encounters:  09/18/22 132/82  09/10/22 (!) 138/96  03/11/22 136/85  Cardiovascular risks associated with hypertension discussed.

## 2022-09-18 NOTE — Assessment & Plan Note (Signed)
Stable.  Diet and nutrition discussed. Continue Zetia 10 mg daily. 

## 2022-09-18 NOTE — Progress Notes (Signed)
Victor Delgado 47 y.o.   Chief Complaint  Patient presents with   Acute Visit    Muscle pain in his right arm, painful since last Sunday Patient wants lab work down     HISTORY OF PRESENT ILLNESS: This is a 47 y.o. male A1A injured right arm 2 weeks ago.  Was seen in emergency room.  MRI did not show any ruptured tendons. Slowly improving.  Still hurting some. No other complaints or medical concerns today. ADDENDUM: There is minimal edema about the biceps insertion without evidence of tendon tear. There is normal cortical and marrow signal about the radial tuberosity. The findings could suggest tendon strain.     Electronically Signed   By: Keane Police D.O.   On: 09/14/2022 10:48  IMPRESSION: 1. Mild edema of the pronator quadratus muscle in the distal forearm concerning for muscle strain. No evidence of intramuscular hematoma or fluid collection. 2. No evidence of acute osseous abnormality of the right forearm.   Electronically Signed: By: Keane Police D.O. On: 09/10/2022 21:47  HPI   Prior to Admission medications   Medication Sig Start Date End Date Taking? Authorizing Provider  ezetimibe (ZETIA) 10 MG tablet TAKE 1 TABLET BY MOUTH EVERY DAY 12/15/21  Yes Arieliz Latino, Ines Bloomer, MD  famotidine (PEPCID) 20 MG tablet Take 1 tablet (20 mg total) by mouth daily. 03/11/22 03/11/23 Yes Nance Pear, MD  meloxicam (MOBIC) 15 MG tablet Take 1 tablet (15 mg total) by mouth daily. 01/18/22 01/18/23 Yes Bruchy Mikel, Ines Bloomer, MD  cetirizine (ZYRTEC ALLERGY) 10 MG tablet Take 1 tablet (10 mg total) by mouth daily for 7 days. 11/09/21 11/16/21  Lannie Fields, PA-C  fluticasone (FLONASE) 50 MCG/ACT nasal spray Place 1 spray into both nostrils daily for 7 days. 11/09/21 11/16/21  Lannie Fields, PA-C  rifaximin (XIFAXAN) 550 MG TABS tablet 1 tab(s) Patient not taking: Reported on 01/18/2022 02/03/09   [provider]    No Known Allergies  Patient Active Problem List    Diagnosis Date Noted   Chronic constipation 05/03/2018   CAD (coronary artery disease) 07/26/2016   Essential hypertension 04/26/2016   Hyperlipidemia 04/26/2016   Obesity (BMI 30-39.9) 04/26/2016    Past Medical History:  Diagnosis Date   CAD (coronary artery disease) 07/26/2016   Chest pain 03/09/2016   Essential hypertension 04/26/2016   Hyperlipidemia 04/26/2016   Obesity (BMI 30-39.9) 04/26/2016    Past Surgical History:  Procedure Laterality Date   APPENDECTOMY  2002   arm surgery Right    per patient right and 20 years ago    Social History   Socioeconomic History   Marital status: Married    Spouse name: Not on file   Number of children: Not on file   Years of education: Not on file   Highest education level: Not on file  Occupational History   Not on file  Tobacco Use   Smoking status: Never   Smokeless tobacco: Never  Substance and Sexual Activity   Alcohol use: No    Alcohol/week: 0.0 standard drinks of alcohol   Drug use: No   Sexual activity: Yes  Other Topics Concern   Not on file  Social History Narrative   Not on file   Social Determinants of Health   Financial Resource Strain: Not on file  Food Insecurity: Not on file  Transportation Needs: Not on file  Physical Activity: Not on file  Stress: Not on file  Social Connections: Not on  file  Intimate Partner Violence: Not on file    Family History  Problem Relation Age of Onset   Hypertension Mother    Diabetes Mother    Heart attack Paternal Uncle    Stroke Maternal Grandmother    Stroke Maternal Grandfather    Heart attack Paternal Grandfather    Esophageal cancer Neg Hx    Pancreatic cancer Neg Hx    Stomach cancer Neg Hx    Liver disease Neg Hx    Colon cancer Neg Hx      Review of Systems  Constitutional: Negative.  Negative for chills and fever.  HENT: Negative.  Negative for congestion and sore throat.   Respiratory: Negative.  Negative for cough and shortness of breath.    Cardiovascular: Negative.  Negative for chest pain and palpitations.  Gastrointestinal:  Negative for abdominal pain, diarrhea, nausea and vomiting.  Genitourinary: Negative.   Skin: Negative.  Negative for rash.  Neurological: Negative.  Negative for dizziness and headaches.  All other systems reviewed and are negative.  Today's Vitals   09/18/22 1107  BP: 132/82  Pulse: (!) 55  Temp: 97.8 F (36.6 C)  TempSrc: Oral  SpO2: 94%  Weight: 285 lb (129.3 kg)  Height: 6' 1"$  (1.854 m)   Body mass index is 37.6 kg/m.   Physical Exam Vitals reviewed.  Constitutional:      Appearance: Normal appearance.  HENT:     Head: Normocephalic.  Eyes:     Extraocular Movements: Extraocular movements intact.  Cardiovascular:     Rate and Rhythm: Normal rate.  Pulmonary:     Effort: Pulmonary effort is normal.  Musculoskeletal:     Comments: Right upper extremity: Mild tenderness to the proximal forearm.  Full range of motion at shoulder, elbow, and wrist.  Neurovascularly intact.  Skin:    General: Skin is warm and dry.     Capillary Refill: Capillary refill takes less than 2 seconds.  Neurological:     General: No focal deficit present.     Mental Status: He is alert and oriented to person, place, and time.  Psychiatric:        Mood and Affect: Mood normal.        Behavior: Behavior normal.      ASSESSMENT & PLAN: A total of 42 minutes was spent with the patient and counseling/coordination of care regarding preparing for this visit, review of most recent office visit notes, review of most recent emergency department visit note, review of most recent MRI imaging report, review of multiple chronic medical conditions under management, review of all medications, need for blood work today, pain management, prognosis, documentation and need for follow-up.  Problem List Items Addressed This Visit       Cardiovascular and Mediastinum   Essential hypertension    Well-controlled  hypertension.  Off medications. BP Readings from Last 3 Encounters:  09/18/22 132/82  09/10/22 (!) 138/96  03/11/22 136/85  Cardiovascular risks associated with hypertension discussed.       Relevant Orders   Comprehensive metabolic panel   CBC with Differential/Platelet     Musculoskeletal and Integument   Muscle strain of right forearm - Primary    Slowly improving.  Recent MRI report reviewed.  No signs of tendon rupture. Pain management discussed.  Continue Tylenol and or Advil as needed. Lifting restrictions recommended. May need orthopedic evaluation if no better or worse during the next couple of weeks.      Relevant Orders  Sedimentation rate     Other   Dyslipidemia    Stable.  Diet and nutrition discussed.  Continue Zetia 10 mg daily.      Relevant Orders   CBC with Differential/Platelet   Lipid panel   Other Visit Diagnoses     Need for vaccination       Relevant Orders   Flu Vaccine QUAD 6+ mos PF IM (Fluarix Quad PF) (Completed)      Patient Instructions  Health Maintenance, Male Adopting a healthy lifestyle and getting preventive care are important in promoting health and wellness. Ask your health care provider about: The right schedule for you to have regular tests and exams. Things you can do on your own to prevent diseases and keep yourself healthy. What should I know about diet, weight, and exercise? Eat a healthy diet  Eat a diet that includes plenty of vegetables, fruits, low-fat dairy products, and lean protein. Do not eat a lot of foods that are high in solid fats, added sugars, or sodium. Maintain a healthy weight Body mass index (BMI) is a measurement that can be used to identify possible weight problems. It estimates body fat based on height and weight. Your health care provider can help determine your BMI and help you achieve or maintain a healthy weight. Get regular exercise Get regular exercise. This is one of the most important  things you can do for your health. Most adults should: Exercise for at least 150 minutes each week. The exercise should increase your heart rate and make you sweat (moderate-intensity exercise). Do strengthening exercises at least twice a week. This is in addition to the moderate-intensity exercise. Spend less time sitting. Even light physical activity can be beneficial. Watch cholesterol and blood lipids Have your blood tested for lipids and cholesterol at 45 years of age, then have this test every 5 years. You may need to have your cholesterol levels checked more often if: Your lipid or cholesterol levels are high. You are older than 45 years of age. You are at high risk for heart disease. What should I know about cancer screening? Many types of cancers can be detected early and may often be prevented. Depending on your health history and family history, you may need to have cancer screening at various ages. This may include screening for: Colorectal cancer. Prostate cancer. Skin cancer. Lung cancer. What should I know about heart disease, diabetes, and high blood pressure? Blood pressure and heart disease High blood pressure causes heart disease and increases the risk of stroke. This is more likely to develop in people who have high blood pressure readings or are overweight. Talk with your health care provider about your target blood pressure readings. Have your blood pressure checked: Every 3-5 years if you are 1-43 years of age. Every year if you are 79 years old or older. If you are between the ages of 66 and 41 and are a current or former smoker, ask your health care provider if you should have a one-time screening for abdominal aortic aneurysm (AAA). Diabetes Have regular diabetes screenings. This checks your fasting blood sugar level. Have the screening done: Once every three years after age 35 if you are at a normal weight and have a low risk for diabetes. More often and at a  younger age if you are overweight or have a high risk for diabetes. What should I know about preventing infection? Hepatitis B If you have a higher risk for hepatitis B, you should  be screened for this virus. Talk with your health care provider to find out if you are at risk for hepatitis B infection. Hepatitis C Blood testing is recommended for: Everyone born from [birth year] through 1963. Anyone with known risk factors for hepatitis C. Sexually transmitted infections (STIs) You should be screened each year for STIs, including gonorrhea and chlamydia, if: You are sexually active and are younger than 45 years of age. You are older than 45 years of age and your health care provider tells you that you are at risk for this type of infection. Your sexual activity has changed since you were last screened, and you are at increased risk for chlamydia or gonorrhea. Ask your health care provider if you are at risk. Ask your health care provider about whether you are at high risk for HIV. Your health care provider may recommend a prescription medicine to help prevent HIV infection. If you choose to take medicine to prevent HIV, you should first get tested for HIV. You should then be tested every 3 months for as long as you are taking the medicine. Follow these instructions at home: Alcohol use Do not drink alcohol if your health care provider tells you not to drink. If you drink alcohol: Limit how much you have to 0-2 drinks a day. Know how much alcohol is in your drink. In the [place] one drink equals one 12 oz bottle of beer (355 mL), one 5 oz glass of wine (148 mL), or one 1 oz glass of hard liquor (44 mL). Lifestyle Do not use any products that contain nicotine or tobacco. These products include cigarettes, chewing tobacco, and vaping devices, such as e-cigarettes. If you need help quitting, ask your health care provider. Do not use street drugs. Do not share needles. Ask your health care provider for help  if you need support or information about quitting drugs. General instructions Schedule regular health, dental, and eye exams. Stay current with your vaccines. Tell your health care provider if: You often feel depressed. You have ever been abused or do not feel safe at home. Summary Adopting a healthy lifestyle and getting preventive care are important in promoting health and wellness. Follow your health care provider's instructions about healthy diet, exercising, and getting tested or screened for diseases. Follow your health care provider's instructions on monitoring your cholesterol and blood pressure. This information is not intended to replace advice given to you by your health care provider. Make sure you discuss any questions you have with your health care provider. Document Revised: 12/13/2020 Document Reviewed: 12/13/2020 Elsevier Patient Education  [place], MD [facility]

## 2022-09-18 NOTE — Patient Instructions (Signed)
Health Maintenance, Male Adopting a healthy lifestyle and getting preventive care are important in promoting health and wellness. Ask your health care provider about: The right schedule for you to have regular tests and exams. Things you can do on your own to prevent diseases and keep yourself healthy. What should I know about diet, weight, and exercise? Eat a healthy diet  Eat a diet that includes plenty of vegetables, fruits, low-fat dairy products, and lean protein. Do not eat a lot of foods that are high in solid fats, added sugars, or sodium. Maintain a healthy weight Body mass index (BMI) is a measurement that can be used to identify possible weight problems. It estimates body fat based on height and weight. Your health care provider can help determine your BMI and help you achieve or maintain a healthy weight. Get regular exercise Get regular exercise. This is one of the most important things you can do for your health. Most adults should: Exercise for at least 150 minutes each week. The exercise should increase your heart rate and make you sweat (moderate-intensity exercise). Do strengthening exercises at least twice a week. This is in addition to the moderate-intensity exercise. Spend less time sitting. Even light physical activity can be beneficial. Watch cholesterol and blood lipids Have your blood tested for lipids and cholesterol at 45 years of age, then have this test every 5 years. You may need to have your cholesterol levels checked more often if: Your lipid or cholesterol levels are high. You are older than 45 years of age. You are at high risk for heart disease. What should I know about cancer screening? Many types of cancers can be detected early and may often be prevented. Depending on your health history and family history, you may need to have cancer screening at various ages. This may include screening for: Colorectal cancer. Prostate cancer. Skin cancer. Lung  cancer. What should I know about heart disease, diabetes, and high blood pressure? Blood pressure and heart disease High blood pressure causes heart disease and increases the risk of stroke. This is more likely to develop in people who have high blood pressure readings or are overweight. Talk with your health care provider about your target blood pressure readings. Have your blood pressure checked: Every 3-5 years if you are 18-39 years of age. Every year if you are 40 years old or older. If you are between the ages of 65 and 75 and are a current or former smoker, ask your health care provider if you should have a one-time screening for abdominal aortic aneurysm (AAA). Diabetes Have regular diabetes screenings. This checks your fasting blood sugar level. Have the screening done: Once every three years after age 45 if you are at a normal weight and have a low risk for diabetes. More often and at a younger age if you are overweight or have a high risk for diabetes. What should I know about preventing infection? Hepatitis B If you have a higher risk for hepatitis B, you should be screened for this virus. Talk with your health care provider to find out if you are at risk for hepatitis B infection. Hepatitis C Blood testing is recommended for: Everyone born from 1945 through 1965. Anyone with known risk factors for hepatitis C. Sexually transmitted infections (STIs) You should be screened each year for STIs, including gonorrhea and chlamydia, if: You are sexually active and are younger than 45 years of age. You are older than 45 years of age and your   health care provider tells you that you are at risk for this type of infection. Your sexual activity has changed since you were last screened, and you are at increased risk for chlamydia or gonorrhea. Ask your health care provider if you are at risk. Ask your health care provider about whether you are at high risk for HIV. Your health care provider  may recommend a prescription medicine to help prevent HIV infection. If you choose to take medicine to prevent HIV, you should first get tested for HIV. You should then be tested every 3 months for as long as you are taking the medicine. Follow these instructions at home: Alcohol use Do not drink alcohol if your health care provider tells you not to drink. If you drink alcohol: Limit how much you have to 0-2 drinks a day. Know how much alcohol is in your drink. In the [place] one drink equals one 12 oz bottle of beer (355 mL), one 5 oz glass of wine (148 mL), or one 1 oz glass of hard liquor (44 mL). Lifestyle Do not use any products that contain nicotine or tobacco. These products include cigarettes, chewing tobacco, and vaping devices, such as e-cigarettes. If you need help quitting, ask your health care provider. Do not use street drugs. Do not share needles. Ask your health care provider for help if you need support or information about quitting drugs. General instructions Schedule regular health, dental, and eye exams. Stay current with your vaccines. Tell your health care provider if: You often feel depressed. You have ever been abused or do not feel safe at home. Summary Adopting a healthy lifestyle and getting preventive care are important in promoting health and wellness. Follow your health care provider's instructions about healthy diet, exercising, and getting tested or screened for diseases. Follow your health care provider's instructions on monitoring your cholesterol and blood pressure. This information is not intended to replace advice given to you by your health care provider. Make sure you discuss any questions you have with your health care provider. Document Revised: 12/13/2020 Document Reviewed: 12/13/2020 Elsevier Patient Education  2023 [employer]  

## 2022-09-19 ENCOUNTER — Other Ambulatory Visit: Payer: Self-pay | Admitting: Emergency Medicine

## 2022-09-19 DIAGNOSIS — E785 Hyperlipidemia, unspecified: Secondary | ICD-10-CM

## 2022-09-19 MED ORDER — NEXLIZET 180-10 MG PO TABS: 1.0000 | ORAL_TABLET | Freq: Every day | ORAL | 3 refills | Status: DC

## 2022-09-20 ENCOUNTER — Encounter: Payer: Self-pay | Admitting: *Deleted

## 2022-09-20 ENCOUNTER — Telehealth: Payer: Self-pay | Admitting: *Deleted

## 2022-09-20 NOTE — Telephone Encounter (Signed)
PA for Nexlizet denied, appeal started, awaiting on response

## 2022-09-20 NOTE — Telephone Encounter (Signed)
PA for Nexlizet submitted, awaiting response Key: [id number]

## 2022-09-21 ENCOUNTER — Other Ambulatory Visit: Payer: Self-pay | Admitting: Emergency Medicine

## 2022-09-21 DIAGNOSIS — E785 Hyperlipidemia, unspecified: Secondary | ICD-10-CM

## 2022-10-05 NOTE — Telephone Encounter (Signed)
Pharmacy Patient Advocate Encounter  Received notification from [facility] that the request for prior authorization for Nexlizet has been denied due to  .    Please be advised we currently do not have a Pharmacist to review denials, therefore you will need to process appeals accordingly as needed. Thanks for your support at this time.   You may call [phone] and ask for Merilyn Baba or call directly at [phone] speak to Appeal Representative

## 2022-10-06 NOTE — Telephone Encounter (Signed)
Appeal was started and fax on 09/22/2022

## 2023-04-23 ENCOUNTER — Ambulatory Visit: Payer: BC Managed Care – PPO | Admitting: Emergency Medicine

## 2023-04-23 ENCOUNTER — Encounter: Payer: Self-pay | Admitting: Emergency Medicine

## 2023-04-23 VITALS — BP 130/82 | HR 59 | Temp 97.6°F | Wt 265.0 lb

## 2023-04-23 DIAGNOSIS — Z23 Encounter for immunization: Secondary | ICD-10-CM | POA: Diagnosis not present

## 2023-04-23 DIAGNOSIS — I10 Essential (primary) hypertension: Secondary | ICD-10-CM

## 2023-04-23 DIAGNOSIS — K5909 Other constipation: Secondary | ICD-10-CM

## 2023-04-23 DIAGNOSIS — D582 Other hemoglobinopathies: Secondary | ICD-10-CM | POA: Insufficient documentation

## 2023-04-23 DIAGNOSIS — K648 Other hemorrhoids: Secondary | ICD-10-CM | POA: Diagnosis not present

## 2023-04-23 DIAGNOSIS — E785 Hyperlipidemia, unspecified: Secondary | ICD-10-CM

## 2023-04-23 LAB — LIPID PANEL
Cholesterol: 215 mg/dL — ABNORMAL HIGH (ref 0–200)
HDL: 46.4 mg/dL (ref >39.00)
LDL Cholesterol: 144 mg/dL — ABNORMAL HIGH (ref 0–99)
NonHDL: 168.62
Total CHOL/HDL Ratio: 5
Triglycerides: 121 mg/dL (ref 0.0–149.0)
VLDL: 24.2 mg/dL (ref 0.0–40.0)

## 2023-04-23 LAB — COMPREHENSIVE METABOLIC PANEL
ALT: 28 U/L (ref 0–53)
AST: 19 U/L (ref 0–37)
Albumin: 4.3 g/dL (ref 3.5–5.2)
Alkaline Phosphatase: 68 U/L (ref 39–117)
BUN: 14 mg/dL (ref 6–23)
CO2: 30 meq/L (ref 19–32)
Calcium: 9.3 mg/dL (ref 8.4–10.5)
Chloride: 103 meq/L (ref 96–112)
Creatinine, Ser: 0.84 mg/dL (ref 0.40–1.50)
GFR: 105.29 mL/min (ref >60.00)
Glucose, Bld: 83 mg/dL (ref 70–99)
Potassium: 3.9 meq/L (ref 3.5–5.1)
Sodium: 140 meq/L (ref 135–145)
Total Bilirubin: 0.6 mg/dL (ref 0.2–1.2)
Total Protein: 7.2 g/dL (ref 6.0–8.3)

## 2023-04-23 LAB — CBC WITH DIFFERENTIAL/PLATELET
Basophils Absolute: 0 10*3/uL (ref 0.0–0.1)
Basophils Relative: 0.5 % (ref 0.0–3.0)
Eosinophils Absolute: 0.1 10*3/uL (ref 0.0–0.7)
Eosinophils Relative: 2.1 % (ref 0.0–5.0)
HCT: 47.5 % (ref 39.0–52.0)
Hemoglobin: 15.6 g/dL (ref 13.0–17.0)
Lymphocytes Relative: 31 % (ref 12.0–46.0)
Lymphs Abs: 2 10*3/uL (ref 0.7–4.0)
MCHC: 32.9 g/dL (ref 30.0–36.0)
MCV: 93.5 fl (ref 78.0–100.0)
Monocytes Absolute: 0.4 10*3/uL (ref 0.1–1.0)
Monocytes Relative: 5.8 % (ref 3.0–12.0)
Neutro Abs: 4 10*3/uL (ref 1.4–7.7)
Neutrophils Relative %: 60.6 % (ref 43.0–77.0)
Platelets: 279 10*3/uL (ref 150.0–400.0)
RBC: 5.09 Mil/uL (ref 4.22–5.81)
RDW: 13.3 % (ref 11.5–15.5)
WBC: 6.5 10*3/uL (ref 4.0–10.5)

## 2023-04-23 LAB — HEMOGLOBIN A1C: Hgb A1c MFr Bld: 5.3 % (ref 4.6–6.5)

## 2023-04-23 NOTE — Assessment & Plan Note (Signed)
Diet and nutrition discussed Not taking any medication at present time Lipid profile done today The 10-year ASCVD risk score (Arnett DK, et al., 2019) is: 3.5%   Values used to calculate the score:     Age: 47 years     Sex: Male     Is Non-Hispanic African American: No     Diabetic: No     Tobacco smoker: No     Systolic Blood Pressure: 130 mmHg     Is BP treated: No     HDL Cholesterol: 45.9 mg/dL     Total Cholesterol: 257 mg/dL

## 2023-04-23 NOTE — Progress Notes (Signed)
Victor Delgado 47 y.o.   Chief Complaint  Patient presents with   Acute Visit    Pt states he donated blood and his iron levels were elevated Pt also experiencing dizziness, and shortness of breath    HISTORY OF PRESENT ILLNESS: This is a 47 y.o. male recently donated blood about 2 weeks ago and was told his hemoglobin was too high Has occasional dizziness and shortness of breath.  No other associated symptoms Also requesting referral to GI doctor for "prolapsed rectum" Has history of chronic constipation. No complaints or medical concerns today.  HPI   Prior to Admission medications   Medication Sig Start Date End Date Taking? Authorizing Provider  Bempedoic Acid-Ezetimibe (NEXLIZET) 180-10 MG TABS Take 1 tablet by mouth daily. Patient not taking: Reported on 04/23/2023 09/19/22   Georgina Quint, MD  cetirizine (ZYRTEC ALLERGY) 10 MG tablet Take 1 tablet (10 mg total) by mouth daily for 7 days. 11/09/21 11/16/21  Orvil Feil, PA-C  famotidine (PEPCID) 20 MG tablet Take 1 tablet (20 mg total) by mouth daily. 03/11/22 03/11/23  Phineas Semen, MD  fluticasone (FLONASE) 50 MCG/ACT nasal spray Place 1 spray into both nostrils daily for 7 days. 11/09/21 11/16/21  Orvil Feil, PA-C  rifaximin (XIFAXAN) 550 MG TABS tablet 1 tab(s) Patient not taking: Reported on 01/18/2022 02/03/09   [provider]    No Known Allergies  Patient Active Problem List   Diagnosis Date Noted   Chronic constipation 05/03/2018   CAD (coronary artery disease) 07/26/2016   Essential hypertension 04/26/2016   Dyslipidemia 04/26/2016   Obesity (BMI 30-39.9) 04/26/2016    Past Medical History:  Diagnosis Date   CAD (coronary artery disease) 07/26/2016   Chest pain 03/09/2016   Essential hypertension 04/26/2016   Hyperlipidemia 04/26/2016   Obesity (BMI 30-39.9) 04/26/2016    Past Surgical History:  Procedure Laterality Date   APPENDECTOMY  2002   arm surgery Right    per patient right  and 20 years ago    Social History   Socioeconomic History   Marital status: Married    Spouse name: Not on file   Number of children: Not on file   Years of education: Not on file   Highest education level: GED or equivalent  Occupational History   Not on file  Tobacco Use   Smoking status: Never   Smokeless tobacco: Never  Substance and Sexual Activity   Alcohol use: No    Alcohol/week: 0.0 standard drinks of alcohol   Drug use: No   Sexual activity: Yes  Other Topics Concern   Not on file  Social History Narrative   Not on file   Social Determinants of Health   Financial Resource Strain: Medium Risk (04/20/2023)   Overall Financial Resource Strain (CARDIA)    Difficulty of Paying Living Expenses: Somewhat hard  Food Insecurity: No Food Insecurity (04/20/2023)   Hunger Vital Sign    Worried About Running Out of Food in the Last Year: Never true    Ran Out of Food in the Last Year: Never true  Transportation Needs: No Transportation Needs (04/20/2023)   PRAPARE - Administrator, Civil Service (Medical): No    Lack of Transportation (Non-Medical): No  Physical Activity: Insufficiently Active (04/20/2023)   Exercise Vital Sign    Days of Exercise per Week: 3 days    Minutes of Exercise per Session: 30 min  Stress: Stress Concern Present (04/20/2023)   [employer]  of Occupational Health - Occupational Stress Questionnaire    Feeling of Stress : To some extent  Social Connections: Moderately Integrated (04/20/2023)   Social Connection and Isolation Panel [NHANES]    Frequency of Communication with Friends and Family: More than three times a week    Frequency of Social Gatherings with Friends and Family: Once a week    Attends Religious Services: More than 4 times per year    Active Member of [employer] or Organizations: No    Attends Engineer, structural: Not on file    Marital Status: Married  Catering manager Violence: Not on file    Family  History  Problem Relation Age of Onset   Hypertension Mother    Diabetes Mother    Heart attack Paternal Uncle    Stroke Maternal Grandmother    Stroke Maternal Grandfather    Heart attack Paternal Grandfather    Esophageal cancer Neg Hx    Pancreatic cancer Neg Hx    Stomach cancer Neg Hx    Liver disease Neg Hx    Colon cancer Neg Hx      Review of Systems  Constitutional: Negative.  Negative for chills and fever.  HENT: Negative.  Negative for congestion and sore throat.   Respiratory: Negative.  Negative for cough and shortness of breath.   Cardiovascular: Negative.  Negative for chest pain and palpitations.  Gastrointestinal:  Negative for abdominal pain, constipation, diarrhea, nausea and vomiting.  Skin: Negative.  Negative for rash.  Neurological: Negative.  Negative for dizziness and headaches.  All other systems reviewed and are negative.   Vitals:   04/23/23 1326  BP: 130/82  Pulse: (!) 59  Temp: 97.6 F (36.4 C)  SpO2: 96%    Physical Exam Vitals reviewed.  Constitutional:      Appearance: Normal appearance.  HENT:     Head: Normocephalic.     Mouth/Throat:     Mouth: Mucous membranes are moist.     Pharynx: Oropharynx is clear.  Eyes:     Extraocular Movements: Extraocular movements intact.     Conjunctiva/sclera: Conjunctivae normal.     Pupils: Pupils are equal, round, and reactive to light.  Cardiovascular:     Rate and Rhythm: Normal rate and regular rhythm.     Pulses: Normal pulses.     Heart sounds: Normal heart sounds.  Pulmonary:     Effort: Pulmonary effort is normal.     Breath sounds: Normal breath sounds.  Abdominal:     Palpations: Abdomen is soft.     Tenderness: There is no abdominal tenderness.  Musculoskeletal:     Cervical back: No tenderness.  Lymphadenopathy:     Cervical: No cervical adenopathy.  Skin:    General: Skin is warm and dry.     Capillary Refill: Capillary refill takes less than 2 seconds.  Neurological:      General: No focal deficit present.     Mental Status: He is alert and oriented to person, place, and time.  Psychiatric:        Mood and Affect: Mood normal.        Behavior: Behavior normal.      ASSESSMENT & PLAN: A total of 44 minutes was spent with the patient and counseling/coordination of care regarding preparing for this visit, review of most recent office visit notes, review of chronic medical conditions under management, review of most recent blood work results, review of all medications, differential diagnosis of polycythemia, need for  blood work today, prognosis, documentation, and need for follow-up.  Problem List Items Addressed This Visit       Cardiovascular and Mediastinum   Essential hypertension    BP Readings from Last 3 Encounters:  04/23/23 130/82  09/18/22 132/82  09/10/22 (!) 138/96  Well-controlled hypertension off medications Cardiovascular risks associated with hypertension discussed       Relevant Orders   Comprehensive metabolic panel   Internal hemorrhoids    Most likely "prolapse of rectum" he refers to Recommend colorectal surgery evaluation Referral placed today Most likely related to chronic constipation      Relevant Orders   Ambulatory referral to Colorectal Surgery     Digestive   Chronic constipation    Advised to stay well-hydrated and increase amount of fiber in his diet        Other   Dyslipidemia    Diet and nutrition discussed Not taking any medication at present time Lipid profile done today The 10-year ASCVD risk score (Arnett DK, et al., 2019) is: 3.5%   Values used to calculate the score:     Age: 68 years     Sex: Male     Is Non-Hispanic African American: No     Diabetic: No     Tobacco smoker: No     Systolic Blood Pressure: 130 mmHg     Is BP treated: No     HDL Cholesterol: 45.9 mg/dL     Total Cholesterol: 257 mg/dL       Relevant Orders   Comprehensive metabolic panel   Hemoglobin A1c   Lipid  panel   Elevated hemoglobin (HCC) - Primary    Differential diagnosis discussed Asymptomatic.  No red flag signs or symptoms CBC and differential along with iron TIBC and ferritin levels done today Hemochromatosis needs to be ruled out      Relevant Orders   CBC with Differential/Platelet   Iron, TIBC and Ferritin Panel   Other Visit Diagnoses     Need for vaccination       Relevant Orders   Flu vaccine trivalent PF, 6mos and older(Flulaval,Afluria,Fluarix,Fluzone) (Completed)      Patient Instructions  Health Maintenance, Male Adopting a healthy lifestyle and getting preventive care are important in promoting health and wellness. Ask your health care provider about: The right schedule for you to have regular tests and exams. Things you can do on your own to prevent diseases and keep yourself healthy. What should I know about diet, weight, and exercise? Eat a healthy diet  Eat a diet that includes plenty of vegetables, fruits, low-fat dairy products, and lean protein. Do not eat a lot of foods that are high in solid fats, added sugars, or sodium. Maintain a healthy weight Body mass index (BMI) is a measurement that can be used to identify possible weight problems. It estimates body fat based on height and weight. Your health care provider can help determine your BMI and help you achieve or maintain a healthy weight. Get regular exercise Get regular exercise. This is one of the most important things you can do for your health. Most adults should: Exercise for at least 150 minutes each week. The exercise should increase your heart rate and make you sweat (moderate-intensity exercise). Do strengthening exercises at least twice a week. This is in addition to the moderate-intensity exercise. Spend less time sitting. Even light physical activity can be beneficial. Watch cholesterol and blood lipids Have your blood tested for lipids and cholesterol at  45 years of age, then have this  test every 5 years. You may need to have your cholesterol levels checked more often if: Your lipid or cholesterol levels are high. You are older than 45 years of age. You are at high risk for heart disease. What should I know about cancer screening? Many types of cancers can be detected early and may often be prevented. Depending on your health history and family history, you may need to have cancer screening at various ages. This may include screening for: Colorectal cancer. Prostate cancer. Skin cancer. Lung cancer. What should I know about heart disease, diabetes, and high blood pressure? Blood pressure and heart disease High blood pressure causes heart disease and increases the risk of stroke. This is more likely to develop in people who have high blood pressure readings or are overweight. Talk with your health care provider about your target blood pressure readings. Have your blood pressure checked: Every 3-5 years if you are 54-41 years of age. Every year if you are 61 years old or older. If you are between the ages of 21 and 60 and are a current or former smoker, ask your health care provider if you should have a one-time screening for abdominal aortic aneurysm (AAA). Diabetes Have regular diabetes screenings. This checks your fasting blood sugar level. Have the screening done: Once every three years after age 79 if you are at a normal weight and have a low risk for diabetes. More often and at a younger age if you are overweight or have a high risk for diabetes. What should I know about preventing infection? Hepatitis B If you have a higher risk for hepatitis B, you should be screened for this virus. Talk with your health care provider to find out if you are at risk for hepatitis B infection. Hepatitis C Blood testing is recommended for: Everyone born from [birth year] through 1965. Anyone with known risk factors for hepatitis C. Sexually transmitted infections (STIs) You should be  screened each year for STIs, including gonorrhea and chlamydia, if: You are sexually active and are younger than 45 years of age. You are older than 45 years of age and your health care provider tells you that you are at risk for this type of infection. Your sexual activity has changed since you were last screened, and you are at increased risk for chlamydia or gonorrhea. Ask your health care provider if you are at risk. Ask your health care provider about whether you are at high risk for HIV. Your health care provider may recommend a prescription medicine to help prevent HIV infection. If you choose to take medicine to prevent HIV, you should first get tested for HIV. You should then be tested every 3 months for as long as you are taking the medicine. Follow these instructions at home: Alcohol use Do not drink alcohol if your health care provider tells you not to drink. If you drink alcohol: Limit how much you have to 0-2 drinks a day. Know how much alcohol is in your drink. In the [place] one drink equals one 12 oz bottle of beer (355 mL), one 5 oz glass of wine (148 mL), or one 1 oz glass of hard liquor (44 mL). Lifestyle Do not use any products that contain nicotine or tobacco. These products include cigarettes, chewing tobacco, and vaping devices, such as e-cigarettes. If you need help quitting, ask your health care provider. Do not use street drugs. Do not share needles. Ask your health  care provider for help if you need support or information about quitting drugs. General instructions Schedule regular health, dental, and eye exams. Stay current with your vaccines. Tell your health care provider if: You often feel depressed. You have ever been abused or do not feel safe at home. Summary Adopting a healthy lifestyle and getting preventive care are important in promoting health and wellness. Follow your health care provider's instructions about healthy diet, exercising, and getting tested  or screened for diseases. Follow your health care provider's instructions on monitoring your cholesterol and blood pressure. This information is not intended to replace advice given to you by your health care provider. Make sure you discuss any questions you have with your health care provider. Document Revised: 12/13/2020 Document Reviewed: 12/13/2020 Elsevier Patient Education  2024 Elsevier Inc.      Edwina Barth, MD [facility]

## 2023-04-23 NOTE — Assessment & Plan Note (Signed)
BP Readings from Last 3 Encounters:  04/23/23 130/82  09/18/22 132/82  09/10/22 (!) 138/96  Well-controlled hypertension off medications Cardiovascular risks associated with hypertension discussed

## 2023-04-23 NOTE — Patient Instructions (Signed)
Health Maintenance, Male Adopting a healthy lifestyle and getting preventive care are important in promoting health and wellness. Ask your health care provider about: The right schedule for you to have regular tests and exams. Things you can do on your own to prevent diseases and keep yourself healthy. What should I know about diet, weight, and exercise? Eat a healthy diet  Eat a diet that includes plenty of vegetables, fruits, low-fat dairy products, and lean protein. Do not eat a lot of foods that are high in solid fats, added sugars, or sodium. Maintain a healthy weight Body mass index (BMI) is a measurement that can be used to identify possible weight problems. It estimates body fat based on height and weight. Your health care provider can help determine your BMI and help you achieve or maintain a healthy weight. Get regular exercise Get regular exercise. This is one of the most important things you can do for your health. Most adults should: Exercise for at least 150 minutes each week. The exercise should increase your heart rate and make you sweat (moderate-intensity exercise). Do strengthening exercises at least twice a week. This is in addition to the moderate-intensity exercise. Spend less time sitting. Even light physical activity can be beneficial. Watch cholesterol and blood lipids Have your blood tested for lipids and cholesterol at 45 years of age, then have this test every 5 years. You may need to have your cholesterol levels checked more often if: Your lipid or cholesterol levels are high. You are older than 45 years of age. You are at high risk for heart disease. What should I know about cancer screening? Many types of cancers can be detected early and may often be prevented. Depending on your health history and family history, you may need to have cancer screening at various ages. This may include screening for: Colorectal cancer. Prostate cancer. Skin cancer. Lung  cancer. What should I know about heart disease, diabetes, and high blood pressure? Blood pressure and heart disease High blood pressure causes heart disease and increases the risk of stroke. This is more likely to develop in people who have high blood pressure readings or are overweight. Talk with your health care provider about your target blood pressure readings. Have your blood pressure checked: Every 3-5 years if you are 18-39 years of age. Every year if you are 40 years old or older. If you are between the ages of 65 and 75 and are a current or former smoker, ask your health care provider if you should have a one-time screening for abdominal aortic aneurysm (AAA). Diabetes Have regular diabetes screenings. This checks your fasting blood sugar level. Have the screening done: Once every three years after age 45 if you are at a normal weight and have a low risk for diabetes. More often and at a younger age if you are overweight or have a high risk for diabetes. What should I know about preventing infection? Hepatitis B If you have a higher risk for hepatitis B, you should be screened for this virus. Talk with your health care provider to find out if you are at risk for hepatitis B infection. Hepatitis C Blood testing is recommended for: Everyone born from 1945 through 1965. Anyone with known risk factors for hepatitis C. Sexually transmitted infections (STIs) You should be screened each year for STIs, including gonorrhea and chlamydia, if: You are sexually active and are younger than 45 years of age. You are older than 45 years of age and your   health care provider tells you that you are at risk for this type of infection. Your sexual activity has changed since you were last screened, and you are at increased risk for chlamydia or gonorrhea. Ask your health care provider if you are at risk. Ask your health care provider about whether you are at high risk for HIV. Your health care provider  may recommend a prescription medicine to help prevent HIV infection. If you choose to take medicine to prevent HIV, you should first get tested for HIV. You should then be tested every 3 months for as long as you are taking the medicine. Follow these instructions at home: Alcohol use Do not drink alcohol if your health care provider tells you not to drink. If you drink alcohol: Limit how much you have to 0-2 drinks a day. Know how much alcohol is in your drink. In the [place] one drink equals one 12 oz bottle of beer (355 mL), one 5 oz glass of wine (148 mL), or one 1 oz glass of hard liquor (44 mL). Lifestyle Do not use any products that contain nicotine or tobacco. These products include cigarettes, chewing tobacco, and vaping devices, such as e-cigarettes. If you need help quitting, ask your health care provider. Do not use street drugs. Do not share needles. Ask your health care provider for help if you need support or information about quitting drugs. General instructions Schedule regular health, dental, and eye exams. Stay current with your vaccines. Tell your health care provider if: You often feel depressed. You have ever been abused or do not feel safe at home. Summary Adopting a healthy lifestyle and getting preventive care are important in promoting health and wellness. Follow your health care provider's instructions about healthy diet, exercising, and getting tested or screened for diseases. Follow your health care provider's instructions on monitoring your cholesterol and blood pressure. This information is not intended to replace advice given to you by your health care provider. Make sure you discuss any questions you have with your health care provider. Document Revised: 12/13/2020 Document Reviewed: 12/13/2020 Elsevier Patient Education  2024 Elsevier Inc.  

## 2023-04-23 NOTE — Assessment & Plan Note (Signed)
Most likely "prolapse of rectum" he refers to Recommend colorectal surgery evaluation Referral placed today Most likely related to chronic constipation

## 2023-04-23 NOTE — Assessment & Plan Note (Signed)
Differential diagnosis discussed Asymptomatic.  No red flag signs or symptoms CBC and differential along with iron TIBC and ferritin levels done today Hemochromatosis needs to be ruled out

## 2023-04-23 NOTE — Assessment & Plan Note (Signed)
Advised to stay well-hydrated and increase amount of fiber in his diet

## 2023-04-24 LAB — IRON,TIBC AND FERRITIN PANEL
%SAT: 27 % (ref 20–48)
Ferritin: 151 ng/mL (ref 38–380)
Iron: 94 ug/dL (ref 50–180)
TIBC: 352 ug/dL (ref 250–425)

## 2023-08-20 ENCOUNTER — Ambulatory Visit (INDEPENDENT_AMBULATORY_CARE_PROVIDER_SITE_OTHER): Payer: 59

## 2023-08-20 ENCOUNTER — Encounter: Payer: Self-pay | Admitting: Podiatry

## 2023-08-20 ENCOUNTER — Ambulatory Visit (INDEPENDENT_AMBULATORY_CARE_PROVIDER_SITE_OTHER): Payer: 59 | Admitting: Podiatry

## 2023-08-20 DIAGNOSIS — M778 Other enthesopathies, not elsewhere classified: Secondary | ICD-10-CM

## 2023-08-20 DIAGNOSIS — M722 Plantar fascial fibromatosis: Secondary | ICD-10-CM

## 2023-08-20 MED ORDER — METHYLPREDNISOLONE 4 MG PO TBPK: ORAL_TABLET | ORAL | 0 refills | Status: DC

## 2023-08-20 NOTE — Patient Instructions (Signed)

## 2023-08-20 NOTE — Progress Notes (Signed)
  Subjective:  Patient ID: Victor Delgado, male    DOB: [birth year],  MRN: [id number]  Chief Complaint  Patient presents with   Foot Pain    RM#14 Left heel pain for several months now no relief.    Discussed the use of AI scribe software for clinical note transcription with the patient, who gave verbal consent to proceed.  History of Present Illness         48 y.o. male  with a history of right foot pain, presents with left foot pain of a few months duration. They have been walking on uneven surfaces in their neighborhood and woods, which exacerbates the discomfort. They deny a specific injury and swelling. The pain is described as 'stepping on a nail' and is constant throughout the day. They have been managing the pain with ibuprofen, which provides minimal relief and does not cause gastrointestinal upset. The right foot remains mildly sore.     Objective:    Physical Exam         General: AAO x3, NAD  Dermatological: Skin is warm, dry and supple bilateral.  There are no open sores, no preulcerative lesions, no rash or signs of infection present.  Vascular: Dorsalis Pedis artery and Posterior Tibial artery pedal pulses are 2/4 bilateral with immedate capillary fill time.  There is no pain with calf compression, swelling, warmth, erythema.   Neruologic: Grossly intact via light touch bilateral. Negative tinel sign  Musculoskeletal: Tenderness to palpation along the plantar tubercle of the calcaneus at the insertion of the plantar fascia on the left side.  There is no pain with lateral compression of calcaneus.  No pain on the Achilles tendon.  Achilles tendon appears to be intact.  No other areas discomfort.  No edema.  MMT 5/5.   No images are attached to the encounter.    Results          Assessment:   Plantar fascia stable heel spur left foot  Plan:  Patient was evaluated and treated and all questions answered.  Assessment and Plan           Plantar Fasciitis with  Heel Spur Chronic left foot pain, exacerbated by walking on uneven surfaces. No specific injury or swelling. Pain described as stepping on a nail. X-ray shows a heel spur. Discussed the role of inflammation and tight muscles in the pathophysiology of the condition. -Start a short course of oral steroids to reduce inflammation.  Prescribed Medrol  Dosepak.  Offered steroid injections today. -Continue daily icing and stretching exercises. -Use a myofascial brace for additional support, especially during walking. -Ensure footwear provides good support; avoid going barefoot or wearing flat sandals. -Consider further treatment options if no improvement, including physical therapy, PRP, EPAT (Extracorporeal Pulse Activation Treatment), or surgery as a last resort.  Radiology: X-rays obtained reviewed of the left foot.  Multiple views obtained.  Calcaneal spurring is present.  No evidence of acute fracture.    Return if symptoms worsen or fail to improve.

## 2024-05-21 ENCOUNTER — Ambulatory Visit: Admitting: Emergency Medicine

## 2024-05-21 VITALS — BP 130/70 | HR 66 | Temp 97.9°F | Ht 73.0 in | Wt 276.0 lb

## 2024-05-21 DIAGNOSIS — Z125 Encounter for screening for malignant neoplasm of prostate: Secondary | ICD-10-CM

## 2024-05-21 DIAGNOSIS — Z1211 Encounter for screening for malignant neoplasm of colon: Secondary | ICD-10-CM

## 2024-05-21 DIAGNOSIS — R29818 Other symptoms and signs involving the nervous system: Secondary | ICD-10-CM | POA: Diagnosis not present

## 2024-05-21 DIAGNOSIS — I10 Essential (primary) hypertension: Secondary | ICD-10-CM

## 2024-05-21 DIAGNOSIS — R42 Dizziness and giddiness: Secondary | ICD-10-CM | POA: Diagnosis not present

## 2024-05-21 LAB — CBC WITH DIFFERENTIAL/PLATELET
Basophils Absolute: 0 K/uL (ref 0.0–0.1)
Basophils Relative: 0.5 % (ref 0.0–3.0)
Eosinophils Absolute: 0.2 K/uL (ref 0.0–0.7)
Eosinophils Relative: 2.4 % (ref 0.0–5.0)
HCT: 49.3 % (ref 39.0–52.0)
Hemoglobin: 16.6 g/dL (ref 13.0–17.0)
Lymphocytes Relative: 27 % (ref 12.0–46.0)
Lymphs Abs: 2 K/uL (ref 0.7–4.0)
MCHC: 33.7 g/dL (ref 30.0–36.0)
MCV: 90.8 fl (ref 78.0–100.0)
Monocytes Absolute: 0.4 K/uL (ref 0.1–1.0)
Monocytes Relative: 6.1 % (ref 3.0–12.0)
Neutro Abs: 4.7 K/uL (ref 1.4–7.7)
Neutrophils Relative %: 64 % (ref 43.0–77.0)
Platelets: 292 K/uL (ref 150.0–400.0)
RBC: 5.43 Mil/uL (ref 4.22–5.81)
RDW: 13 % (ref 11.5–15.5)
WBC: 7.3 K/uL (ref 4.0–10.5)

## 2024-05-21 LAB — URINALYSIS
Hgb urine dipstick: NEGATIVE
Ketones, ur: NEGATIVE
Leukocytes,Ua: NEGATIVE
Nitrite: NEGATIVE
Specific Gravity, Urine: 1.025 (ref 1.000–1.030)
Total Protein, Urine: NEGATIVE
Urine Glucose: NEGATIVE
Urobilinogen, UA: 0.2 (ref 0.0–1.0)
pH: 6 (ref 5.0–8.0)

## 2024-05-21 LAB — COMPREHENSIVE METABOLIC PANEL WITH GFR
ALT: 43 U/L (ref 0–53)
AST: 26 U/L (ref 0–37)
Albumin: 4.6 g/dL (ref 3.5–5.2)
Alkaline Phosphatase: 64 U/L (ref 39–117)
BUN: 10 mg/dL (ref 6–23)
CO2: 27 meq/L (ref 19–32)
Calcium: 9.5 mg/dL (ref 8.4–10.5)
Chloride: 103 meq/L (ref 96–112)
Creatinine, Ser: 0.92 mg/dL (ref 0.40–1.50)
GFR: 99.68 mL/min (ref >60.00)
Glucose, Bld: 94 mg/dL (ref 70–99)
Potassium: 3.9 meq/L (ref 3.5–5.1)
Sodium: 140 meq/L (ref 135–145)
Total Bilirubin: 0.7 mg/dL (ref 0.2–1.2)
Total Protein: 7.1 g/dL (ref 6.0–8.3)

## 2024-05-21 LAB — LIPID PANEL
Cholesterol: 254 mg/dL — ABNORMAL HIGH (ref 0–200)
HDL: 46.3 mg/dL (ref >39.00)
LDL Cholesterol: 182 mg/dL — ABNORMAL HIGH (ref 0–99)
NonHDL: 207.66
Total CHOL/HDL Ratio: 5
Triglycerides: 127 mg/dL (ref 0.0–149.0)
VLDL: 25.4 mg/dL (ref 0.0–40.0)

## 2024-05-21 LAB — VITAMIN D 25 HYDROXY (VIT D DEFICIENCY, FRACTURES): VITD: 14.55 ng/mL — ABNORMAL LOW (ref 30.00–100.00)

## 2024-05-21 LAB — TSH: TSH: 2.17 u[IU]/mL (ref 0.35–5.50)

## 2024-05-21 LAB — HEMOGLOBIN A1C: Hgb A1c MFr Bld: 5.7 % (ref 4.6–6.5)

## 2024-05-21 LAB — PSA: PSA: 0.4 ng/mL (ref 0.10–4.00)

## 2024-05-21 LAB — VITAMIN B12: Vitamin B-12: 341 pg/mL (ref 211–911)

## 2024-05-21 NOTE — Assessment & Plan Note (Signed)
 BP Readings from Last 3 Encounters:  05/21/24 (!) 142/90  04/23/23 130/82  09/18/22 132/82  Elevated blood pressure reading in the office today but normal readings at home.  Off medication Advised to continue monitoring blood pressure readings at home daily for the next several weeks and contact the office if numbers persistently abnormal Cardiovascular risks associated with hypertension discussed

## 2024-05-21 NOTE — Patient Instructions (Signed)
 Fatigue If you have fatigue, you feel tired all the time and have a lack of energy or a lack of motivation. Fatigue may make it difficult to start or complete tasks because of exhaustion. Occasional or mild fatigue is often a normal response to activity or life. However, long-term (chronic) or extreme fatigue may be a symptom of a medical condition such as: Depression. Not having enough red blood cells or hemoglobin in the blood (anemia). A problem with a small gland located in the lower front part of the neck (thyroid disorder). Rheumatologic conditions. These are problems related to the body's defense system (immune system). Infections, especially certain viral infections. Fatigue can also lead to negative health outcomes over time. Follow these instructions at home: Medicines Take over-the-counter and prescription medicines only as told by your health care provider. Take a multivitamin if told by your health care provider. Do not use herbal or dietary supplements unless they are approved by your health care provider. Eating and drinking  Avoid heavy meals in the evening. Eat a well-balanced diet, which includes lean proteins, whole grains, plenty of fruits and vegetables, and low-fat dairy products. Avoid eating or drinking too many products with caffeine in them. Avoid alcohol. Drink enough fluid to keep your urine pale yellow. Activity  Exercise regularly, as told by your health care provider. Use or practice techniques to help you relax, such as yoga, tai chi, meditation, or massage therapy. Lifestyle Change situations that cause you stress. Try to keep your work and personal schedules in balance. Do not use recreational or illegal drugs. General instructions Monitor your fatigue for any changes. Go to bed and get up at the same time every day. Avoid fatigue by pacing yourself during the day and getting enough sleep at night. Maintain a healthy weight. Contact a health care  provider if: Your fatigue does not get better. You have a fever. You suddenly lose or gain weight. You have headaches. You have trouble falling asleep or sleeping through the night. You feel angry, guilty, anxious, or sad. You have swelling in your legs or another part of your body. Get help right away if: You feel confused, feel like you might faint, or faint. Your vision is blurry or you have a severe headache. You have severe pain in your abdomen, your back, or the area between your waist and hips (pelvis). You have chest pain, shortness of breath, or an irregular or fast heartbeat. You are unable to urinate, or you urinate less than normal. You have abnormal bleeding from the rectum, nose, lungs, nipples, or, if you are male, the vagina. You vomit blood. You have thoughts about hurting yourself or others. These symptoms may be an emergency. Get help right away. Call 911. Do not wait to see if the symptoms will go away. Do not drive yourself to the hospital. Get help right away if you feel like you may hurt yourself or others, or have thoughts about taking your own life. Go to your nearest emergency room or: Call 911. Call the National Suicide Prevention Lifeline at (262)721-8699 or 988. This is open 24 hours a day. Text the Crisis Text Line at 8450584327. Summary If you have fatigue, you feel tired all the time and have a lack of energy or a lack of motivation. Fatigue may make it difficult to start or complete tasks because of exhaustion. Long-term (chronic) or extreme fatigue may be a symptom of a medical condition. Exercise regularly, as told by your health care provider.  Change situations that cause you stress. Try to keep your work and personal schedules in balance. This information is not intended to replace advice given to you by your health care provider. Make sure you discuss any questions you have with your health care provider. Document Revised: 05/16/2021 Document  Reviewed: 05/16/2021 Elsevier Patient Education  2024 [employer].

## 2024-05-21 NOTE — Progress Notes (Signed)
 Victor Delgado 48 y.o.   Chief Complaint  Patient presents with   Dizziness    Patient here for dizzy, hot face, weak, tired, headaches. Patient has been feeling like this 2 weeks. States he was just drinking apple cider and almost blacked out.  States he has not been feeling well. Wants cholesterol and blood sugar levels checked     HISTORY OF PRESENT ILLNESS: This is a 48 y.o. male complaining of dizziness which started about 2 weeks ago Feeling weak and tired with occasional headaches Had a near syncopal episode once Accompanied by wife.  She states he snores loud.  Had sleep study in 2017 which was unremarkable No recent flulike symptoms.  No recent viral syndromes. No other associated symptoms No other complaints or medical concerns today.  Dizziness Pertinent negatives include no abdominal pain, chest pain, chills, congestion, coughing, fever, headaches, nausea, rash, sore throat, vomiting or weakness.     Prior to Admission medications   Not on File    No Known Allergies  Patient Active Problem List   Diagnosis Date Noted   Internal hemorrhoids 04/23/2023   Chronic constipation 05/03/2018   CAD (coronary artery disease) 07/26/2016   Essential hypertension 04/26/2016   Dyslipidemia 04/26/2016   Obesity (BMI 30-39.9) 04/26/2016    Past Medical History:  Diagnosis Date   CAD (coronary artery disease) 07/26/2016   Chest pain 03/09/2016   Essential hypertension 04/26/2016   Hyperlipidemia 04/26/2016   Obesity (BMI 30-39.9) 04/26/2016    Past Surgical History:  Procedure Laterality Date   APPENDECTOMY  2002   arm surgery Right    per patient right and 20 years ago    Social History   Socioeconomic History   Marital status: Married    Spouse name: Not on file   Number of children: Not on file   Years of education: Not on file   Highest education level: GED or equivalent  Occupational History   Not on file  Tobacco Use   Smoking status: Never   Smokeless  tobacco: Never  Substance and Sexual Activity   Alcohol use: No    Alcohol/week: 0.0 standard drinks of alcohol   Drug use: No   Sexual activity: Yes  Other Topics Concern   Not on file  Social History Narrative   Not on file   Social Drivers of Health   Financial Resource Strain: Low Risk  (05/21/2024)   Overall Financial Resource Strain (CARDIA)    Difficulty of Paying Living Expenses: Not very hard  Food Insecurity: No Food Insecurity (05/21/2024)   Hunger Vital Sign    Worried About Running Out of Food in the Last Year: Never true    Ran Out of Food in the Last Year: Never true  Transportation Needs: No Transportation Needs (05/21/2024)   PRAPARE - Administrator, Civil Service (Medical): No    Lack of Transportation (Non-Medical): No  Physical Activity: Insufficiently Active (05/21/2024)   Exercise Vital Sign    Days of Exercise per Week: 4 days    Minutes of Exercise per Session: 30 min  Stress: No Stress Concern Present (05/21/2024)   [employer] of Occupational Health - Occupational Stress Questionnaire    Feeling of Stress: Only a little  Social Connections: Moderately Integrated (05/21/2024)   Social Connection and Isolation Panel    Frequency of Communication with Friends and Family: More than three times a week    Frequency of Social Gatherings with Friends and Family: Once  a week    Attends Religious Services: More than 4 times per year    Active Member of Clubs or Organizations: No    Attends Engineer, structural: Not on file    Marital Status: Married  Catering manager Violence: Not on file    Family History  Problem Relation Age of Onset   Hypertension Mother    Diabetes Mother    Heart attack Paternal Uncle    Stroke Maternal Grandmother    Stroke Maternal Grandfather    Heart attack Paternal Grandfather    Esophageal cancer Neg Hx    Pancreatic cancer Neg Hx    Stomach cancer Neg Hx    Liver disease Neg Hx    Colon  cancer Neg Hx      Review of Systems  Constitutional:  Positive for malaise/fatigue. Negative for chills and fever.  HENT: Negative.  Negative for congestion and sore throat.   Respiratory: Negative.  Negative for cough and shortness of breath.   Cardiovascular: Negative.  Negative for chest pain and palpitations.  Gastrointestinal:  Negative for abdominal pain, diarrhea, nausea and vomiting.  Genitourinary: Negative.  Negative for dysuria and hematuria.  Skin: Negative.  Negative for rash.  Neurological:  Positive for dizziness. Negative for sensory change, focal weakness, weakness and headaches.  All other systems reviewed and are negative.   Vitals:   05/21/24 1430  BP: (!) 142/90  Pulse: 66  Temp: 97.9 F (36.6 C)  SpO2: 97%    Physical Exam Vitals reviewed.  Constitutional:      Appearance: Normal appearance.  HENT:     Head: Normocephalic.     Right Ear: Tympanic membrane, ear canal and external ear normal.     Left Ear: Tympanic membrane, ear canal and external ear normal.     Mouth/Throat:     Mouth: Mucous membranes are moist.     Pharynx: Oropharynx is clear.  Eyes:     Extraocular Movements: Extraocular movements intact.     Pupils: Pupils are equal, round, and reactive to light.  Cardiovascular:     Rate and Rhythm: Normal rate and regular rhythm.     Pulses: Normal pulses.     Heart sounds: Normal heart sounds.  Pulmonary:     Effort: Pulmonary effort is normal.     Breath sounds: Normal breath sounds.  Abdominal:     Palpations: Abdomen is soft.     Tenderness: There is no abdominal tenderness.  Musculoskeletal:     Cervical back: No tenderness.  Lymphadenopathy:     Cervical: No cervical adenopathy.  Skin:    General: Skin is warm and dry.     Capillary Refill: Capillary refill takes less than 2 seconds.  Neurological:     General: No focal deficit present.     Mental Status: He is alert and oriented to person, place, and time.  Psychiatric:         Mood and Affect: Mood normal.        Behavior: Behavior normal.      ASSESSMENT & PLAN: A total of 41 minutes was spent with the patient and counseling/coordination of care regarding preparing for this visit, review of most recent office visit notes, review of multiple chronic medical conditions and their management, differential diagnosis of fatigue and dizziness and need for workup, suspected sleep apnea and need for testing, review of all medications, review of most recent bloodwork results, review of health maintenance items, education on nutrition, prognosis, documentation,  and need for follow up.   Problem List Items Addressed This Visit       Cardiovascular and Mediastinum   Essential hypertension   BP Readings from Last 3 Encounters:  05/21/24 (!) 142/90  04/23/23 130/82  09/18/22 132/82  Elevated blood pressure reading in the office today but normal readings at home.  Off medication Advised to continue monitoring blood pressure readings at home daily for the next several weeks and contact the office if numbers persistently abnormal Cardiovascular risks associated with hypertension discussed          Other   Dizziness - Primary   Clinically stable.  No red flag signs or symptoms. Unremarkable examination.  Stable vital signs.  Afebrile. Differential diagnosis discussed. Recommend blood work and urine test today Stress contributing.  Mental health management discussed Suspected sleep apnea contributing to symptoms as well Recommend home sleep test and evaluation ED precautions given Advised to contact the office if no better or worse during the next several days      Relevant Orders   Urinalysis   Comprehensive metabolic panel with GFR   CBC with Differential/Platelet   Hemoglobin A1c   Lipid panel   TSH   PSA   Vitamin B12   VITAMIN D 25 Hydroxy (Vit-D Deficiency, Fractures)   Suspected sleep apnea   Snores loudly per wife.  Wakes up tired.  Has  daytime somnolence. Needs sleep studies. Referral placed today.      Relevant Orders   Home sleep test   Other Visit Diagnoses       Screening for colon cancer       Relevant Orders   Ambulatory referral to Gastroenterology      Patient Instructions  Fatigue If you have fatigue, you feel tired all the time and have a lack of energy or a lack of motivation. Fatigue may make it difficult to start or complete tasks because of exhaustion. Occasional or mild fatigue is often a normal response to activity or life. However, long-term (chronic) or extreme fatigue may be a symptom of a medical condition such as: Depression. Not having enough red blood cells or hemoglobin in the blood (anemia). A problem with a small gland located in the lower front part of the neck (thyroid disorder). Rheumatologic conditions. These are problems related to the body's defense system (immune system). Infections, especially certain viral infections. Fatigue can also lead to negative health outcomes over time. Follow these instructions at home: Medicines Take over-the-counter and prescription medicines only as told by your health care provider. Take a multivitamin if told by your health care provider. Do not use herbal or dietary supplements unless they are approved by your health care provider. Eating and drinking  Avoid heavy meals in the evening. Eat a well-balanced diet, which includes lean proteins, whole grains, plenty of fruits and vegetables, and low-fat dairy products. Avoid eating or drinking too many products with caffeine in them. Avoid alcohol. Drink enough fluid to keep your urine pale yellow. Activity  Exercise regularly, as told by your health care provider. Use or practice techniques to help you relax, such as yoga, tai chi, meditation, or massage therapy. Lifestyle Change situations that cause you stress. Try to keep your work and personal schedules in balance. Do not use recreational  or illegal drugs. General instructions Monitor your fatigue for any changes. Go to bed and get up at the same time every day. Avoid fatigue by pacing yourself during the day and getting enough sleep  at night. Maintain a healthy weight. Contact a health care provider if: Your fatigue does not get better. You have a fever. You suddenly lose or gain weight. You have headaches. You have trouble falling asleep or sleeping through the night. You feel angry, guilty, anxious, or sad. You have swelling in your legs or another part of your body. Get help right away if: You feel confused, feel like you might faint, or faint. Your vision is blurry or you have a severe headache. You have severe pain in your abdomen, your back, or the area between your waist and hips (pelvis). You have chest pain, shortness of breath, or an irregular or fast heartbeat. You are unable to urinate, or you urinate less than normal. You have abnormal bleeding from the rectum, nose, lungs, nipples, or, if you are male, the vagina. You vomit blood. You have thoughts about hurting yourself or others. These symptoms may be an emergency. Get help right away. Call 911. Do not wait to see if the symptoms will go away. Do not drive yourself to the hospital. Get help right away if you feel like you may hurt yourself or others, or have thoughts about taking your own life. Go to your nearest emergency room or: Call 911. Call the National Suicide Prevention Lifeline at 929-422-1811 or 988. This is open 24 hours a day. Text the Crisis Text Line at 325-404-7431. Summary If you have fatigue, you feel tired all the time and have a lack of energy or a lack of motivation. Fatigue may make it difficult to start or complete tasks because of exhaustion. Long-term (chronic) or extreme fatigue may be a symptom of a medical condition. Exercise regularly, as told by your health care provider. Change situations that cause you stress. Try to  keep your work and personal schedules in balance. This information is not intended to replace advice given to you by your health care provider. Make sure you discuss any questions you have with your health care provider. Document Revised: 05/16/2021 Document Reviewed: 05/16/2021 Elsevier Patient Education  2024 Elsevier Inc.    Emil Schaumann, MD [facility]

## 2024-05-21 NOTE — Assessment & Plan Note (Signed)
 Clinically stable.  No red flag signs or symptoms. Unremarkable examination.  Stable vital signs.  Afebrile. Differential diagnosis discussed. Recommend blood work and urine test today Stress contributing.  Mental health management discussed Suspected sleep apnea contributing to symptoms as well Recommend home sleep test and evaluation ED precautions given Advised to contact the office if no better or worse during the next several days

## 2024-05-21 NOTE — Assessment & Plan Note (Signed)
 Snores loudly per wife.  Wakes up tired.  Has daytime somnolence. Needs sleep studies. Referral placed today.

## 2024-05-22 ENCOUNTER — Ambulatory Visit: Payer: Self-pay | Admitting: Emergency Medicine

## 2024-05-22 DIAGNOSIS — R5383 Other fatigue: Secondary | ICD-10-CM

## 2024-05-22 DIAGNOSIS — E785 Hyperlipidemia, unspecified: Secondary | ICD-10-CM

## 2024-05-26 ENCOUNTER — Other Ambulatory Visit (INDEPENDENT_AMBULATORY_CARE_PROVIDER_SITE_OTHER)

## 2024-05-26 ENCOUNTER — Ambulatory Visit: Payer: Self-pay | Admitting: Emergency Medicine

## 2024-05-26 DIAGNOSIS — E291 Testicular hypofunction: Secondary | ICD-10-CM

## 2024-05-26 DIAGNOSIS — R5383 Other fatigue: Secondary | ICD-10-CM

## 2024-05-26 LAB — TESTOSTERONE: Testosterone: 192.13 ng/dL — ABNORMAL LOW (ref 300.00–890.00)

## 2024-05-27 ENCOUNTER — Ambulatory Visit: Payer: Self-pay | Admitting: Emergency Medicine

## 2024-05-27 ENCOUNTER — Other Ambulatory Visit (INDEPENDENT_AMBULATORY_CARE_PROVIDER_SITE_OTHER)

## 2024-05-27 DIAGNOSIS — E291 Testicular hypofunction: Secondary | ICD-10-CM

## 2024-05-27 LAB — TESTOSTERONE: Testosterone: 167.21 ng/dL — ABNORMAL LOW (ref 300.00–890.00)

## 2024-05-27 MED ORDER — XYOSTED 75 MG/0.5ML ~~LOC~~ SOAJ: 75.0000 mg | SUBCUTANEOUS | 5 refills | Status: DC

## 2024-05-27 MED ORDER — EZETIMIBE 10 MG PO TABS: 10.0000 mg | ORAL_TABLET | Freq: Every day | ORAL | 3 refills | Status: AC

## 2024-06-17 ENCOUNTER — Telehealth: Payer: Self-pay | Admitting: Emergency Medicine

## 2024-06-17 NOTE — Telephone Encounter (Unsigned)
 Copied from CRM [id number]. Topic: Clinical - Prescription Issue >> Jun 17, 2024  3:07 PM Ashley R wrote: Reason for CRM: Testosterone Enanthate (XYOSTED) 75 MG/0.5ML [facility] requested further information from provider before filling.

## 2024-06-18 ENCOUNTER — Encounter: Payer: Self-pay | Admitting: Emergency Medicine

## 2024-06-18 ENCOUNTER — Ambulatory Visit: Admitting: Emergency Medicine

## 2024-06-18 VITALS — BP 160/100 | HR 68 | Temp 98.1°F | Ht 73.0 in | Wt 284.0 lb

## 2024-06-18 DIAGNOSIS — E291 Testicular hypofunction: Secondary | ICD-10-CM | POA: Diagnosis not present

## 2024-06-18 DIAGNOSIS — R053 Chronic cough: Secondary | ICD-10-CM | POA: Diagnosis not present

## 2024-06-18 DIAGNOSIS — J22 Unspecified acute lower respiratory infection: Secondary | ICD-10-CM | POA: Diagnosis not present

## 2024-06-18 MED ORDER — HYDROCODONE BIT-HOMATROP MBR 5-1.5 MG/5ML PO SOLN: 5.0000 mL | Freq: Every evening | ORAL | 0 refills | Status: AC | PRN

## 2024-06-18 MED ORDER — XYOSTED 75 MG/0.5ML ~~LOC~~ SOAJ: 75.0000 mg | SUBCUTANEOUS | 5 refills | Status: DC

## 2024-06-18 MED ORDER — BENZONATATE 200 MG PO CAPS: 200.0000 mg | ORAL_CAPSULE | Freq: Two times a day (BID) | ORAL | 0 refills | Status: AC | PRN

## 2024-06-18 MED ORDER — AZITHROMYCIN 250 MG PO TABS: ORAL_TABLET | ORAL | 0 refills | Status: AC

## 2024-06-18 NOTE — Telephone Encounter (Signed)
 I did call but pharmacy does not open until 9am I will call back

## 2024-06-18 NOTE — Assessment & Plan Note (Signed)
 Cough management discussed Recommend over-the-counter Mucinex  DM along with cough drops Advised to rest and stay well-hydrated Tessalon  200 mg 3 times a day as needed Hycodan syrup at bedtime

## 2024-06-18 NOTE — Patient Instructions (Signed)
 Acute Bronchitis, Adult  Acute bronchitis is when air tubes in the lungs (bronchi) suddenly get swollen. The condition can make it hard for you to breathe. In adults, acute bronchitis usually goes away within 2 weeks. A cough caused by bronchitis may last up to 3 weeks. Smoking, allergies, and asthma can make the condition worse. What are the causes? Germs that cause cold and flu (viruses). The most common cause of this condition is the virus that causes the common cold. Bacteria. Substances that bother (irritate) the lungs, including: Smoke from cigarettes and other types of tobacco. Dust and pollen. Fumes from chemicals, gases, or burned fuel. Indoor or outdoor air pollution. What increases the risk? A weak body's defense system. This is also called the immune system. Any condition that affects your lungs and breathing, such as asthma. What are the signs or symptoms? A cough. Coughing up clear, yellow, or green mucus. Making high-pitched whistling sounds when you breathe, most often when you breathe out (wheezing). Runny or stuffy nose. Having too much mucus in your lungs (chest congestion). Shortness of breath. Body aches. A sore throat. How is this treated? Acute bronchitis may go away over time without treatment. Your doctor may tell you to: Drink more fluids. This will help thin your mucus so it is easier to cough up. Use a device that gets medicine into your lungs (inhaler). Use a vaporizer or a humidifier. These are machines that add water to the air. This helps with coughing and poor breathing. Take a medicine that thins mucus and helps clear it from your lungs. Take a medicine that prevents or stops coughing. It is not common to take an antibiotic medicine for this condition. Follow these instructions at home:  Take over-the-counter and prescription medicines only as told by your doctor. Use an inhaler, vaporizer, or humidifier as told by your doctor. Take two teaspoons  (10 mL) of honey at bedtime. This helps lessen your coughing at night. Drink enough fluid to keep your pee (urine) pale yellow. Do not smoke or use any products that contain nicotine or tobacco. If you need help quitting, ask your doctor. Get a lot of rest. Return to your normal activities when your doctor says that it is safe. Keep all follow-up visits. How is this prevented?  Wash your hands often with soap and water for at least 20 seconds. If you cannot use soap and water, use hand sanitizer. Avoid contact with people who have cold symptoms. Try not to touch your mouth, nose, or eyes with your hands. Avoid breathing in smoke or chemical fumes. Make sure to get the flu shot every year. Contact a doctor if: Your symptoms do not get better in 2 weeks. You have trouble coughing up the mucus. Your cough keeps you awake at night. You have a fever. Get help right away if: You cough up blood. You have chest pain. You have very bad shortness of breath. You faint or keep feeling like you are going to faint. You have a very bad headache. Your fever or chills get worse. These symptoms may be an emergency. Get help right away. Call your local emergency services (911 in the [place] Do not wait to see if the symptoms will go away. Do not drive yourself to the hospital. Summary Acute bronchitis is when air tubes in the lungs (bronchi) suddenly get swollen. In adults, acute bronchitis usually goes away within 2 weeks. Drink more fluids. This will help thin your mucus so it is easier  to cough up. Take over-the-counter and prescription medicines only as told by your doctor. Contact a doctor if your symptoms do not improve after 2 weeks of treatment. This information is not intended to replace advice given to you by your health care provider. Make sure you discuss any questions you have with your health care provider. Document Revised: 11/24/2020 Document Reviewed: 11/24/2020 Elsevier Patient  Education  2024 [employer].

## 2024-06-18 NOTE — Progress Notes (Signed)
 Victor Delgado 47 y.o.   Chief Complaint  Patient presents with   Sore Throat    Pt states that he has his cough and sore throat since Friday he has tried dayquil and nyquil to help and cough syrup nothing seems to be working     HISTORY OF PRESENT ILLNESS: This is a 48 y.o. male complaining of flulike symptoms that started 5 days ago Complaining of sore throat and persistent cough No other associated symptoms No other complaints or medical concerns today.  Sore Throat  Associated symptoms include congestion and coughing. Pertinent negatives include no abdominal pain, diarrhea, headaches or vomiting.     Prior to Admission medications   Medication Sig Start Date End Date Taking? Authorizing Provider  ezetimibe  (ZETIA ) 10 MG tablet Take 1 tablet (10 mg total) by mouth daily. 05/27/24  Yes Rayven Hendrickson, Emil Schanz, MD  Testosterone Enanthate (XYOSTED) 75 MG/0.5ML SOAJ Inject 75 mg into the skin once a week. 05/27/24  Yes Purcell Emil Schanz, MD    No Known Allergies  Patient Active Problem List   Diagnosis Date Noted   Dizziness 05/21/2024   Suspected sleep apnea 05/21/2024   Internal hemorrhoids 04/23/2023   Chronic constipation 05/03/2018   CAD (coronary artery disease) 07/26/2016   Essential hypertension 04/26/2016   Dyslipidemia 04/26/2016   Obesity (BMI 30-39.9) 04/26/2016    Past Medical History:  Diagnosis Date   CAD (coronary artery disease) 07/26/2016   Chest pain 03/09/2016   Essential hypertension 04/26/2016   Hyperlipidemia 04/26/2016   Obesity (BMI 30-39.9) 04/26/2016    Past Surgical History:  Procedure Laterality Date   APPENDECTOMY  2002   arm surgery Right    per patient right and 20 years ago    Social History   Socioeconomic History   Marital status: Married    Spouse name: Not on file   Number of children: Not on file   Years of education: Not on file   Highest education level: GED or equivalent  Occupational History   Not on file  Tobacco  Use   Smoking status: Never   Smokeless tobacco: Never  Substance and Sexual Activity   Alcohol use: No    Alcohol/week: 0.0 standard drinks of alcohol   Drug use: No   Sexual activity: Yes  Other Topics Concern   Not on file  Social History Narrative   Not on file   Social Drivers of Health   Financial Resource Strain: Low Risk  (05/21/2024)   Overall Financial Resource Strain (CARDIA)    Difficulty of Paying Living Expenses: Not very hard  Food Insecurity: No Food Insecurity (05/21/2024)   Hunger Vital Sign    Worried About Running Out of Food in the Last Year: Never true    Ran Out of Food in the Last Year: Never true  Transportation Needs: No Transportation Needs (05/21/2024)   PRAPARE - Administrator, Civil Service (Medical): No    Lack of Transportation (Non-Medical): No  Physical Activity: Insufficiently Active (05/21/2024)   Exercise Vital Sign    Days of Exercise per Week: 4 days    Minutes of Exercise per Session: 30 min  Stress: No Stress Concern Present (05/21/2024)   [employer] of Occupational Health - Occupational Stress Questionnaire    Feeling of Stress: Only a little  Social Connections: Moderately Integrated (05/21/2024)   Social Connection and Isolation Panel    Frequency of Communication with Friends and Family: More than three times a week  Frequency of Social Gatherings with Friends and Family: Once a week    Attends Religious Services: More than 4 times per year    Active Member of [employer] or Organizations: No    Attends Engineer, Structural: Not on file    Marital Status: Married  Catering Manager Violence: Not on file    Family History  Problem Relation Age of Onset   Hypertension Mother    Diabetes Mother    Heart attack Paternal Uncle    Stroke Maternal Grandmother    Stroke Maternal Grandfather    Heart attack Paternal Grandfather    Esophageal cancer Neg Hx    Pancreatic cancer Neg Hx    Stomach cancer Neg  Hx    Liver disease Neg Hx    Colon cancer Neg Hx      Review of Systems  Constitutional: Negative.  Negative for chills and fever.  HENT:  Positive for congestion and sore throat.   Respiratory:  Positive for cough.   Cardiovascular: Negative.  Negative for chest pain and palpitations.  Gastrointestinal:  Negative for abdominal pain, diarrhea, nausea and vomiting.  Genitourinary: Negative.  Negative for dysuria and hematuria.  Skin: Negative.  Negative for rash.  Neurological: Negative.  Negative for dizziness and headaches.  All other systems reviewed and are negative.   Vitals:   06/18/24 1554  BP: (!) 160/100  Pulse: 68  Temp: 98.1 F (36.7 C)  SpO2: 98%    Physical Exam Vitals reviewed.  Constitutional:      Appearance: Normal appearance.  HENT:     Head: Normocephalic.     Mouth/Throat:     Mouth: Mucous membranes are moist.     Pharynx: Oropharynx is clear.  Eyes:     Extraocular Movements: Extraocular movements intact.     Pupils: Pupils are equal, round, and reactive to light.  Cardiovascular:     Rate and Rhythm: Normal rate and regular rhythm.     Pulses: Normal pulses.     Heart sounds: Normal heart sounds.  Pulmonary:     Effort: Pulmonary effort is normal.     Breath sounds: Normal breath sounds.  Abdominal:     Palpations: Abdomen is soft.     Tenderness: There is no abdominal tenderness.  Musculoskeletal:     Cervical back: No tenderness.  Lymphadenopathy:     Cervical: No cervical adenopathy.  Skin:    General: Skin is warm and dry.     Capillary Refill: Capillary refill takes less than 2 seconds.  Neurological:     General: No focal deficit present.     Mental Status: He is alert and oriented to person, place, and time.  Psychiatric:        Mood and Affect: Mood normal.        Behavior: Behavior normal.      ASSESSMENT & PLAN: Problem List Items Addressed This Visit       Respiratory   Lower respiratory infection - Primary    Clinically stable.  No red flag signs or symptoms. No clinical findings of pneumonia. Recommend daily azithromycin for 5 days Symptom management discussed Advised to rest and stay well-hydrated ED precautions given Advised to contact the office if no better or worse during the next several days.      Relevant Medications   azithromycin (ZITHROMAX) 250 MG tablet     Other   Persistent cough   Cough management discussed Recommend over-the-counter Mucinex  DM along with cough drops  Advised to rest and stay well-hydrated Tessalon  200 mg 3 times a day as needed Hycodan syrup at bedtime      Relevant Medications   HYDROcodone  bit-homatropine (HYCODAN) 5-1.5 MG/5ML syrup   benzonatate  (TESSALON ) 200 MG capsule   Other Visit Diagnoses       Hypogonadism in male       Relevant Medications   Testosterone Enanthate (XYOSTED) 75 MG/0.5ML SOAJ      Patient Instructions  Acute Bronchitis, Adult  Acute bronchitis is when air tubes in the lungs (bronchi) suddenly get swollen. The condition can make it hard for you to breathe. In adults, acute bronchitis usually goes away within 2 weeks. A cough caused by bronchitis may last up to 3 weeks. Smoking, allergies, and asthma can make the condition worse. What are the causes? Germs that cause cold and flu (viruses). The most common cause of this condition is the virus that causes the common cold. Bacteria. Substances that bother (irritate) the lungs, including: Smoke from cigarettes and other types of tobacco. Dust and pollen. Fumes from chemicals, gases, or burned fuel. Indoor or outdoor air pollution. What increases the risk? A weak body's defense system. This is also called the immune system. Any condition that affects your lungs and breathing, such as asthma. What are the signs or symptoms? A cough. Coughing up clear, yellow, or green mucus. Making high-pitched whistling sounds when you breathe, most often when you breathe out  (wheezing). Runny or stuffy nose. Having too much mucus in your lungs (chest congestion). Shortness of breath. Body aches. A sore throat. How is this treated? Acute bronchitis may go away over time without treatment. Your doctor may tell you to: Drink more fluids. This will help thin your mucus so it is easier to cough up. Use a device that gets medicine into your lungs (inhaler). Use a vaporizer or a humidifier. These are machines that add water to the air. This helps with coughing and poor breathing. Take a medicine that thins mucus and helps clear it from your lungs. Take a medicine that prevents or stops coughing. It is not common to take an antibiotic medicine for this condition. Follow these instructions at home:  Take over-the-counter and prescription medicines only as told by your doctor. Use an inhaler, vaporizer, or humidifier as told by your doctor. Take two teaspoons (10 mL) of honey at bedtime. This helps lessen your coughing at night. Drink enough fluid to keep your pee (urine) pale yellow. Do not smoke or use any products that contain nicotine or tobacco. If you need help quitting, ask your doctor. Get a lot of rest. Return to your normal activities when your doctor says that it is safe. Keep all follow-up visits. How is this prevented?  Wash your hands often with soap and water for at least 20 seconds. If you cannot use soap and water, use hand sanitizer. Avoid contact with people who have cold symptoms. Try not to touch your mouth, nose, or eyes with your hands. Avoid breathing in smoke or chemical fumes. Make sure to get the flu shot every year. Contact a doctor if: Your symptoms do not get better in 2 weeks. You have trouble coughing up the mucus. Your cough keeps you awake at night. You have a fever. Get help right away if: You cough up blood. You have chest pain. You have very bad shortness of breath. You faint or keep feeling like you are going to  faint. You have a very bad headache.  Your fever or chills get worse. These symptoms may be an emergency. Get help right away. Call your local emergency services (911 in the [place] Do not wait to see if the symptoms will go away. Do not drive yourself to the hospital. Summary Acute bronchitis is when air tubes in the lungs (bronchi) suddenly get swollen. In adults, acute bronchitis usually goes away within 2 weeks. Drink more fluids. This will help thin your mucus so it is easier to cough up. Take over-the-counter and prescription medicines only as told by your doctor. Contact a doctor if your symptoms do not improve after 2 weeks of treatment. This information is not intended to replace advice given to you by your health care provider. Make sure you discuss any questions you have with your health care provider. Document Revised: 11/24/2020 Document Reviewed: 11/24/2020 Elsevier Patient Education  2024 Elsevier Inc.    Emil Schaumann, MD [facility]

## 2024-06-18 NOTE — Assessment & Plan Note (Signed)
 Clinically stable.  No red flag signs or symptoms. No clinical findings of pneumonia. Recommend daily azithromycin for 5 days Symptom management discussed Advised to rest and stay well-hydrated ED precautions given Advised to contact the office if no better or worse during the next several days.

## 2024-06-20 ENCOUNTER — Other Ambulatory Visit (HOSPITAL_COMMUNITY): Payer: Self-pay

## 2024-06-20 ENCOUNTER — Telehealth: Payer: Self-pay

## 2024-06-20 NOTE — Telephone Encounter (Signed)
 Pharmacy Patient Advocate Encounter   Received notification from Physician's Office that prior authorization for Xyosted 75 mg/0.5 ml is required/requested.   Insurance verification completed.   The patient is insured through [facility].   Per test claim: PA required; PA submitted to above mentioned insurance via Tried to complete prior auth via Latent but plan will not load. Key/confirmation #/[id number] Status is pending  I will update key after we prior auth is submitted.

## 2024-06-23 ENCOUNTER — Other Ambulatory Visit (HOSPITAL_COMMUNITY): Payer: Self-pay

## 2024-06-23 NOTE — Telephone Encounter (Signed)
 Per test claim: Xyosted 75mg /0.11ml injection is a plan/benefit exclusion.   To use testosterone gel, testosteron solution or Natesto.   A prior authorization is required for these as well.   Please advise.

## 2024-06-24 ENCOUNTER — Other Ambulatory Visit (HOSPITAL_COMMUNITY): Payer: Self-pay

## 2024-06-24 ENCOUNTER — Telehealth: Payer: Self-pay

## 2024-06-24 ENCOUNTER — Other Ambulatory Visit: Payer: Self-pay | Admitting: Emergency Medicine

## 2024-06-24 DIAGNOSIS — E291 Testicular hypofunction: Secondary | ICD-10-CM

## 2024-06-24 MED ORDER — TESTOSTERONE 50 MG/5GM (1%) TD GEL: 5.0000 g | Freq: Every day | TRANSDERMAL | 5 refills | Status: AC

## 2024-06-24 NOTE — Telephone Encounter (Signed)
 Please send in another alternative for patient as the injection is not included in there plan and I will start the prior authorization for them

## 2024-06-24 NOTE — Telephone Encounter (Signed)
 The PA for testosterone has been submitted. Please see new telephone encounter.

## 2024-06-24 NOTE — Telephone Encounter (Signed)
 New prescription sent to pharmacy of record today.  Thanks.

## 2024-06-24 NOTE — Telephone Encounter (Signed)
 Pharmacy Patient Advocate Encounter  Received notification from [facility] that Prior Authorization for Testosterone 50mg /5gm (1%) gel has been APPROVED from 06/24/24 to 06/25/27   PA #/Case ID/Reference #: [id number]

## 2024-06-24 NOTE — Telephone Encounter (Signed)
 Pharmacy Patient Advocate Encounter   Received notification from Pt Calls Messages that prior authorization for Testosterone 50mg /5gm (1%) gel  is required/requested.   Insurance verification completed.   The patient is insured through [facility].   Per test claim: PA required; PA submitted to above mentioned insurance via Latent Key/confirmation #/EOC [facility] Status is pending

## 2024-09-09 ENCOUNTER — Other Ambulatory Visit (HOSPITAL_COMMUNITY): Payer: Self-pay

## 2024-09-12 ENCOUNTER — Encounter: Payer: Self-pay | Admitting: Emergency Medicine

## 2024-09-12 NOTE — Telephone Encounter (Signed)
 Please advise.
